# Patient Record
Sex: Female | Born: 1955 | Race: White | Hispanic: No | Smoking: Current every day smoker
Health system: Southern US, Community
[De-identification: ages and names within clinical notes are randomized; demographics above are authoritative.]

## PROBLEM LIST (undated history)

## (undated) DIAGNOSIS — F419 Anxiety disorder, unspecified: Secondary | ICD-10-CM

## (undated) HISTORY — PX: TONSILLECTOMY: SUR1361

---

## 1999-07-12 ENCOUNTER — Other Ambulatory Visit: Admission: RE | Admit: 1999-07-12 | Discharge: 1999-07-12 | Payer: Self-pay | Admitting: *Deleted

## 2001-10-27 ENCOUNTER — Other Ambulatory Visit: Admission: RE | Admit: 2001-10-27 | Discharge: 2001-10-27 | Payer: Self-pay | Admitting: Obstetrics & Gynecology

## 2002-11-09 ENCOUNTER — Ambulatory Visit (HOSPITAL_COMMUNITY): Admission: RE | Admit: 2002-11-09 | Discharge: 2002-11-09 | Payer: Self-pay | Admitting: Family Medicine

## 2002-11-09 ENCOUNTER — Encounter: Payer: Self-pay | Admitting: Family Medicine

## 2003-12-14 ENCOUNTER — Ambulatory Visit (HOSPITAL_COMMUNITY): Admission: RE | Admit: 2003-12-14 | Discharge: 2003-12-14 | Payer: Self-pay | Admitting: Family Medicine

## 2004-05-01 ENCOUNTER — Other Ambulatory Visit: Admission: RE | Admit: 2004-05-01 | Discharge: 2004-05-01 | Payer: Self-pay | Admitting: Obstetrics and Gynecology

## 2004-06-02 ENCOUNTER — Emergency Department (HOSPITAL_COMMUNITY): Admission: EM | Admit: 2004-06-02 | Discharge: 2004-06-02 | Payer: Self-pay | Admitting: Emergency Medicine

## 2007-06-26 ENCOUNTER — Observation Stay (HOSPITAL_COMMUNITY): Admission: AD | Admit: 2007-06-26 | Discharge: 2007-06-27 | Payer: Self-pay | Admitting: Cardiology

## 2007-06-26 ENCOUNTER — Encounter: Payer: Self-pay | Admitting: Emergency Medicine

## 2008-06-08 ENCOUNTER — Ambulatory Visit (HOSPITAL_COMMUNITY): Admission: RE | Admit: 2008-06-08 | Discharge: 2008-06-08 | Payer: Self-pay | Admitting: Family Medicine

## 2009-05-09 ENCOUNTER — Ambulatory Visit (HOSPITAL_COMMUNITY): Admission: RE | Admit: 2009-05-09 | Discharge: 2009-05-09 | Payer: Self-pay | Admitting: Family Medicine

## 2009-11-01 ENCOUNTER — Ambulatory Visit (HOSPITAL_COMMUNITY): Admission: RE | Admit: 2009-11-01 | Discharge: 2009-11-01 | Payer: Self-pay | Admitting: Family Medicine

## 2009-11-29 ENCOUNTER — Ambulatory Visit (HOSPITAL_COMMUNITY): Admission: RE | Admit: 2009-11-29 | Discharge: 2009-11-29 | Payer: Self-pay | Admitting: Internal Medicine

## 2010-09-25 ENCOUNTER — Ambulatory Visit (HOSPITAL_COMMUNITY)
Admission: RE | Admit: 2010-09-25 | Discharge: 2010-09-25 | Payer: Self-pay | Source: Home / Self Care | Attending: Internal Medicine | Admitting: Internal Medicine

## 2011-01-30 NOTE — Cardiovascular Report (Signed)
Crystal Douglas, Crystal Douglas                 ACCOUNT NO.:  [id number]   MEDICAL RECORD NO.:  [id number]          PATIENT TYPE:  INP   LOCATION:  4734                         FACILITY:  [facility]   PHYSICIAN:  Ritta Slot, MD     DATE OF BIRTH:  August 31, 1956   DATE OF PROCEDURE:  06/27/2007  DATE OF DISCHARGE:  06/27/2007                            CARDIAC CATHETERIZATION   CARDIAC CATHETERIZATION:   PROCEDURE PERFORMED:  1. Selective coronary angiography with native coronary circulation by      the Judkins technique.  2. Retrograde left heart catheterization.  3. Left ventricular angiography.   COMPLICATIONS:  None.   ENTRY SITE:  Right femoral artery.   DYE USED:  Omnipaque.   PATIENT PROFILE:  This is a 55 year old, heavy smoker with chest pain.  She has had negative troponins and negative ECGs overnight; however, she  is brought to the cath lab for further delineation of her coronary  arteries, as she has high risk factors for coronary artery disease.   PROCEDURE:  The patient was brought to the second floor of [facility] to the cardiac catheterization laboratory.  The right groin was  prepped and draped in the usual sterile fashion, 20 mL of 1% lidocaine  was infiltrated into the right groin around the right femoral artery.  She was given 3 mg of Versed and 50 mcg of fentanyl intravenous for  sedation.  The right groin was accessed using a regular needle, and the  J-wire was passed through that regular needle.  The needle was removed,  and a 6-French catheter was placed into the right femoral artery.  A JR4  catheter was inserted in the 6-French catheter over J-wire, and passed  up the descending aorta over the aortic artery and down the ascending  aorta.  It was engaged into the right coronary artery.  The right  coronary was injected with dye.  Views were obtained of the right  coronary artery and the cranial.  The right coronary catheter was then  exchanged for JL4  catheter.  It was exchanged over J-wire.  The JRL4  catheter was then inserted into the left main.  Images of the left main  were taken in AP cranial,  lateral cranial and AP caudal.  The JL4  catheter was then exchanged out for a pigtail catheter.  The pigtail was  inserted in the LV.  The LV gram was taken in RAO projection with 30 mL  of dye injected at a rate of 10 mL per second.  Complications were none.  Groin shots were taken to see if a closure device could be inserted.  Unfortunately, the sheath was just proximal to the bifurcation of the  SFA and the deep saphenous artery.  It was therefore decided not to  close as a risk for perforation.   FINDINGS:  Aortic pressure 89/52/69.  LV pressure is 91/2/10.  Pullback  pressures show 87/48/66.  There was no evidence of aortic stenosis by  pullback gradient.  1. Left main coronary artery appears normal.  2. Left anterior descending on coronary  artery appears normal.  3. The left circumflex artery appears normal.  4. The right coronary artery appears normal.  5. Ventriculogram showed normal ejection fraction with no evidence of      any wall motion abnormalities, and no evidence of mitral      regurgitation.   FINAL DIAGNOSES:  1. Normal coronary arteries.  2. Normal left ventriculography with a normal ejection fraction of      65%.   IMPRESSION:  Most likely noncardiac chest pain.   PLAN:  I have advised the patient to stop smoking, and I have advised  her to come back to see me in clinic in 10 days' time.      Ritta Slot, MD  Electronically Signed     HS/MEDQ  D:  06/27/2007  T:  06/28/2007  Job:  [id number]

## 2011-02-01 ENCOUNTER — Ambulatory Visit (INDEPENDENT_AMBULATORY_CARE_PROVIDER_SITE_OTHER): Payer: Self-pay | Admitting: Psychology

## 2011-02-01 DIAGNOSIS — F331 Major depressive disorder, recurrent, moderate: Secondary | ICD-10-CM

## 2011-02-01 DIAGNOSIS — F411 Generalized anxiety disorder: Secondary | ICD-10-CM

## 2011-02-02 NOTE — Discharge Summary (Signed)
**Note Crystal via Obfuscation** Douglas, [name]                 ACCOUNT NO.:  [id number]   MEDICAL RECORD NO.:  [id number]          PATIENT TYPE:  INP   LOCATION:  4734                         FACILITY:  [facility]   PHYSICIAN:  Madaline Savage, M.D.DATE OF BIRTH:  05/05/1956   DATE OF ADMISSION:  06/26/2007  DATE OF DISCHARGE:  06/27/2007                               DISCHARGE SUMMARY   DISCHARGE DIAGNOSES:  1. Chest pain; negative myocardial infarction.  Patent coronary      arteries with normal EF of 65%.  2. Small lung nodules 7 x 4 mm in the left lower lobe; follow with a      CT scan in 12 months.  3. Urinary tract infection.  Treated with Cipro.  With E. coli.  4. Tobacco abuse.  5. Hypotension secondary to medication.  6. Shellfish allergy.   DISCHARGE CONDITION:  Improved and stable.   PROCEDURES:  June 27, 2007 combined left heart catheterization by Dr.  Ritta Slot.  Negative coronary disease.   DISCHARGE MEDICATIONS:  1. Chantix 0.5 mg one daily for 3 days and 0.5 mg one twice a day for      3 days, then 1 mg twice a day.  Prescriptions given  2. Stop smoking.  3. Cipro 250 mg twice a day for 7 days for urinary infection.  4. Prilosec 20 mg daily at least for 2 weeks.  5. Xanax as before.   DISCHARGE INSTRUCTIONS:  1. He needs a CAT scan of the chest in 1 year; to see the primary      physician for arranging.  2. He may return to work July 01, 2007.  3. Increase activity slowly.  May shower or bathe.  No lifting for 2      days.  No driving for 2 days and no sexual activity for 2 days.  4. Wash right groin catheterization site with soap and water.  Call if      any bleeding, swelling or drainage.  5. See Dr. Lynnea Ferrier in approximately 10 days.  The office will call      with date and time.   HISTORY OF PRESENT ILLNESS:  A 55 year old white female patient of Dr.  Phillips Odor in [place], presented to [facility] ER with chest pain that  woke her from sleep at around 3:00 a.m. She  described midsternal with  some radiation to back and left shoulder, with associated symptoms --  described as a 5-6/10 pain.  She also complained of weakness; went to  the ER at [facility].  She was better after aspirin and sublingual  nitroglycerin with relief and no recurrence.  The week previous she had  chest heaviness at work with exertion; it would last 5-10 minutes and  resolve.  Prior to that she had no real history of health issues.   PAST MEDICAL HISTORY:  No coronary disease.  No hypertension.  Unknown  cholesterol.  She is menopausal.  She had also lost approximately 30  pounds with the job, where she is walking much more.   ALLERGIES:  PENICILLIN, TETRACYCLINE, CODEINE and SHELLFISH.  FAMILY HISTORY:  Positive for coronary disease in the female side of the  family; none in the female.   OUTPATIENT MEDICATIONS:  Xanax at bedtime.   SOCIAL HISTORY AND REVIEW OF SYSTEMS:  See H&P.   PHYSICAL EXAMINATION AT DISCHARGE:  Blood pressure 90s/50s; previously  that day she had been as low as 78/47.  She was given fluid, but this  was secondary to having been on Imdur and Lopressor.  Pulse 62, respiratory 20, temperature 97.8, oxygen saturation 96%.  HEART:  S1, S2 regular rate and rhythm.  LUNGS:  Clear.  ABDOMEN:  Positive bowel sounds.  EXTREMITIES: No edema and palpable pulses.   LABORATORY DATA:  Hemoglobin 12, hematocrit 35.4, WBC 7.3.  Prothrombin  time 14, INR 1.1 on heparin and she was therapeutic.  Sodium 140,  potassium 3.9, chloride 107, CO2 29, glucose 93, BUN 10,  creatinine  0.77, calcium 8.8.  Hemoglobin A1c 5.9.  CKs 48, 51, 51; MBs 1.9, 1.6;  troponin I  was 0.01 to less than 0.01.  Total cholesterol 139, triglycerides 51, HDL 46, LDL 83.  TSH 1.309.  Urinalysis:  Positive nitrites, small amount of leukocytes, wbc 3-6.  On  her culture she was positive for E. coli, which was sensitive to her  Cipro.   CHEST X-RAY:  (On admission)  Revealed no acute  cardiopulmonary disease;  but an 11 mm x 5 mm right upper lobe pulmonary nodule.  A chest CT was  recommended; not present on prior exam of 2005.   OTHER RADIOLOGY:  A CT of her chest revealed no pulmonary nodule as  identified in the region of the right lung that was found on chest x-  ray.  It was felt to be artifactual on the chest x-ray; but, there was a  7 x  4 mm nodule in the left lower lobe, which the radiologist notes  nodules of that side are statistically highly likely to be benign, but  current follow-up guidelines recommended a 12 month follow-up CT to  ensure lack of growth.   EKG:  Sinus rhythm without acute changes.   HOSPITAL COURSE:  Ms. [name] was admitted from [facility] to  [facility] by __________ , secondary to chest pain that seemed unstable  angina, with periods of what appeared to be stable angina prior to the  episode that woke her from sleep.  She was admitted to [facility]; was  started on Imdur and IV heparin.  She had no further episodes.  Her  pressure did drop due to her Lopressor and Imdur additions.  We gave her  fluid bolus the next morning and then she went to the catheterization  lab; was found to have normal course.   An incidental note was the lung nodule, which we discussed in detail and  that she would need follow-up.  Again, we recommend stopping tobacco  use.  We had tobacco consultation with her and she was given a  prescription for  Chantix.  She she does not truly want to stop smoking at this point, but  hopefully the fear of the nodule changing into something will motivate  her somewhat.   The patient was stable at discharge and would follow up with Dr.  Lynnea Ferrier.      Crystal Douglas. Crystal Douglas, N.P.    ______________________________  Madaline Savage, M.D.    LRI/MEDQ  D:  06/29/2007  T:  06/30/2007  Job:  [id number]   cc:   Sandria Senter  Lynnea Ferrier, MD  Edsel Petrin, D.O.

## 2011-02-05 ENCOUNTER — Ambulatory Visit (HOSPITAL_COMMUNITY): Payer: Self-pay | Admitting: Psychology

## 2011-02-15 ENCOUNTER — Encounter (INDEPENDENT_AMBULATORY_CARE_PROVIDER_SITE_OTHER): Payer: BC Managed Care – PPO | Admitting: Psychology

## 2011-02-15 DIAGNOSIS — F411 Generalized anxiety disorder: Secondary | ICD-10-CM

## 2011-02-15 DIAGNOSIS — F332 Major depressive disorder, recurrent severe without psychotic features: Secondary | ICD-10-CM

## 2011-03-01 ENCOUNTER — Encounter (INDEPENDENT_AMBULATORY_CARE_PROVIDER_SITE_OTHER): Payer: BC Managed Care – PPO | Admitting: Psychology

## 2011-03-01 DIAGNOSIS — F411 Generalized anxiety disorder: Secondary | ICD-10-CM

## 2011-03-01 DIAGNOSIS — F332 Major depressive disorder, recurrent severe without psychotic features: Secondary | ICD-10-CM

## 2011-03-20 ENCOUNTER — Encounter (INDEPENDENT_AMBULATORY_CARE_PROVIDER_SITE_OTHER): Payer: BC Managed Care – PPO | Admitting: Psychology

## 2011-03-20 DIAGNOSIS — F332 Major depressive disorder, recurrent severe without psychotic features: Secondary | ICD-10-CM

## 2011-03-20 DIAGNOSIS — F411 Generalized anxiety disorder: Secondary | ICD-10-CM

## 2011-04-10 ENCOUNTER — Encounter (HOSPITAL_COMMUNITY): Payer: BC Managed Care – PPO | Admitting: Psychology

## 2011-06-28 LAB — CBC
HCT: 35.4 — ABNORMAL LOW
HCT: 40.6
Hemoglobin: 12
Hemoglobin: 13.7
MCHC: 33.8
MCHC: 33.8
MCV: 90.7
MCV: 91.9
Platelets: 267
Platelets: 302
RBC: 3.85 — ABNORMAL LOW
RBC: 4.47
RDW: 13.1
RDW: 13.3
WBC: 7.3
WBC: 8.3

## 2011-06-28 LAB — BASIC METABOLIC PANEL
BUN: 10
BUN: 13
CO2: 28
CO2: 29
Calcium: 8.8
Calcium: 9
Chloride: 107
Chloride: 107
Creatinine, Ser: 0.76
Creatinine, Ser: 0.77
GFR calc Af Amer: >60
GFR calc Af Amer: >60
GFR calc non Af Amer: >60
GFR calc non Af Amer: >60
Glucose, Bld: 93
Glucose, Bld: 95
Potassium: 3.9
Potassium: 4.2
Sodium: 140
Sodium: 140

## 2011-06-28 LAB — URINE CULTURE
Colony Count: >=100000
Special Requests: NEGATIVE

## 2011-06-28 LAB — POCT CARDIAC MARKERS
CKMB, poc: 1.5
CKMB, poc: <1 — ABNORMAL LOW
CKMB, poc: <1 — ABNORMAL LOW
Myoglobin, poc: 55.9
Myoglobin, poc: 63.1
Myoglobin, poc: 71.1
Operator id: 106841
Operator id: 211291
Operator id: 211291
Troponin i, poc: <0.05
Troponin i, poc: <0.05
Troponin i, poc: <0.05

## 2011-06-28 LAB — CARDIAC PANEL(CRET KIN+CKTOT+MB+TROPI)
CK, MB: 1.6
CK, MB: 1.6
CK, MB: 1.9
Relative Index: INVALID
Relative Index: INVALID
Relative Index: INVALID
Total CK: 48
Total CK: 51
Total CK: 51
Troponin I: 0.01
Troponin I: 0.01
Troponin I: <0.01

## 2011-06-28 LAB — DIFFERENTIAL
Basophils Absolute: 0
Basophils Relative: 0
Eosinophils Absolute: 0.1
Eosinophils Relative: 1
Lymphocytes Relative: 25
Lymphs Abs: 2.1
Monocytes Absolute: 0.8 — ABNORMAL HIGH
Monocytes Relative: 10
Neutro Abs: 5.3
Neutrophils Relative %: 63

## 2011-06-28 LAB — LIPID PANEL
Cholesterol: 139
HDL: 46
LDL Cholesterol: 83
Total CHOL/HDL Ratio: 3
Triglycerides: 51
VLDL: 10

## 2011-06-28 LAB — URINE MICROSCOPIC-ADD ON

## 2011-06-28 LAB — PROTIME-INR
INR: 1.1
INR: 1.1
Prothrombin Time: 14
Prothrombin Time: 14.1

## 2011-06-28 LAB — URINALYSIS, ROUTINE W REFLEX MICROSCOPIC
Bilirubin Urine: NEGATIVE
Glucose, UA: NEGATIVE
Ketones, ur: NEGATIVE
Nitrite: POSITIVE — AB
Protein, ur: NEGATIVE
Specific Gravity, Urine: 1.012
Urobilinogen, UA: 0.2
pH: 6

## 2011-06-28 LAB — HEPARIN LEVEL (UNFRACTIONATED): Heparin Unfractionated: 0.89 — ABNORMAL HIGH

## 2011-06-28 LAB — APTT: aPTT: 36

## 2011-06-28 LAB — HEMOGLOBIN A1C
Hgb A1c MFr Bld: 5.9
Mean Plasma Glucose: 133

## 2011-06-28 LAB — TSH: TSH: 1.309

## 2011-06-28 LAB — D-DIMER, QUANTITATIVE: D-Dimer, Quant: 0.3

## 2011-06-28 LAB — MAGNESIUM: Magnesium: 2

## 2012-06-17 ENCOUNTER — Other Ambulatory Visit (HOSPITAL_COMMUNITY): Payer: Self-pay | Admitting: Internal Medicine

## 2012-06-17 DIAGNOSIS — Z139 Encounter for screening, unspecified: Secondary | ICD-10-CM

## 2012-06-19 ENCOUNTER — Ambulatory Visit (HOSPITAL_COMMUNITY)
Admission: RE | Admit: 2012-06-19 | Discharge: 2012-06-19 | Disposition: A | Discharge status: Home / Self Care | Payer: Managed Care, Other (non HMO) | Source: Ambulatory Visit | Attending: Internal Medicine | Admitting: Internal Medicine

## 2012-06-19 DIAGNOSIS — Z139 Encounter for screening, unspecified: Secondary | ICD-10-CM

## 2012-06-19 DIAGNOSIS — Z1231 Encounter for screening mammogram for malignant neoplasm of breast: Secondary | ICD-10-CM | POA: Insufficient documentation

## 2013-05-12 ENCOUNTER — Encounter (INDEPENDENT_AMBULATORY_CARE_PROVIDER_SITE_OTHER): Payer: Self-pay | Admitting: *Deleted

## 2013-05-13 ENCOUNTER — Encounter (INDEPENDENT_AMBULATORY_CARE_PROVIDER_SITE_OTHER): Payer: Self-pay

## 2013-12-22 ENCOUNTER — Telehealth (INDEPENDENT_AMBULATORY_CARE_PROVIDER_SITE_OTHER): Payer: Self-pay | Admitting: *Deleted

## 2013-12-22 ENCOUNTER — Other Ambulatory Visit (INDEPENDENT_AMBULATORY_CARE_PROVIDER_SITE_OTHER): Payer: Self-pay | Admitting: *Deleted

## 2013-12-22 DIAGNOSIS — Z86010 Personal history of colonic polyps: Secondary | ICD-10-CM

## 2013-12-22 DIAGNOSIS — Z1211 Encounter for screening for malignant neoplasm of colon: Secondary | ICD-10-CM

## 2013-12-22 DIAGNOSIS — Z800 Family history of malignant neoplasm of digestive organs: Secondary | ICD-10-CM

## 2013-12-22 NOTE — Telephone Encounter (Signed)
Patient needs movi prep 

## 2013-12-23 MED ORDER — PEG-KCL-NACL-NASULF-NA ASC-C 100 G PO SOLR: 1.0000 | Freq: Once | ORAL | Status: DC

## 2014-02-09 ENCOUNTER — Telehealth (INDEPENDENT_AMBULATORY_CARE_PROVIDER_SITE_OTHER): Payer: Self-pay | Admitting: *Deleted

## 2014-02-09 NOTE — Telephone Encounter (Signed)
  Procedure: tcs  Reason/Indication:  Hx polyps, fam hx colon ca  Has patient had this procedure before?  Yes, 2009 -- scanned  If so, when, by whom and where?    Is there a family history of colon cancer?  Yes, father  Who?  What age when diagnosed?    Is patient diabetic?   no      Does patient have prosthetic heart valve?  no  Do you have a pacemaker?  no  Has patient ever had endocarditis? no  Has patient had joint replacement within last 12 months?  no  Does patient tend to be constipated or take laxatives? no  Is patient on Coumadin, Plavix and/or Aspirin? no  Medications: alprazolam 1 mg bid nightly, lexapro 10 mg 1/2 tab daily  Allergies: pcn, tetracycline, codeina   Medication Adjustment:   Procedure date & time: 03/11/14 at 1030

## 2014-02-10 NOTE — Telephone Encounter (Signed)
agree

## 2014-02-12 ENCOUNTER — Other Ambulatory Visit (HOSPITAL_COMMUNITY): Payer: Self-pay | Admitting: Internal Medicine

## 2014-02-12 DIAGNOSIS — Z1231 Encounter for screening mammogram for malignant neoplasm of breast: Secondary | ICD-10-CM

## 2014-02-15 ENCOUNTER — Ambulatory Visit (HOSPITAL_COMMUNITY)
Admission: RE | Admit: 2014-02-15 | Discharge: 2014-02-15 | Disposition: A | Discharge status: Home / Self Care | Payer: Managed Care, Other (non HMO) | Source: Ambulatory Visit | Attending: Internal Medicine | Admitting: Internal Medicine

## 2014-02-15 DIAGNOSIS — Z1231 Encounter for screening mammogram for malignant neoplasm of breast: Secondary | ICD-10-CM | POA: Insufficient documentation

## 2014-03-11 ENCOUNTER — Encounter (HOSPITAL_COMMUNITY): Payer: Self-pay

## 2014-03-11 ENCOUNTER — Ambulatory Visit (HOSPITAL_COMMUNITY)
Admission: RE | Admit: 2014-03-11 | Discharge: 2014-03-11 | Disposition: A | Discharge status: Home / Self Care | Payer: Managed Care, Other (non HMO) | Source: Ambulatory Visit | Attending: Internal Medicine | Admitting: Internal Medicine

## 2014-03-11 ENCOUNTER — Encounter (HOSPITAL_COMMUNITY)
Admission: RE | Disposition: A | Discharge status: Home / Self Care | Payer: Self-pay | Source: Ambulatory Visit | Attending: Internal Medicine

## 2014-03-11 DIAGNOSIS — Z800 Family history of malignant neoplasm of digestive organs: Secondary | ICD-10-CM

## 2014-03-11 DIAGNOSIS — Z79899 Other long term (current) drug therapy: Secondary | ICD-10-CM | POA: Insufficient documentation

## 2014-03-11 DIAGNOSIS — D128 Benign neoplasm of rectum: Secondary | ICD-10-CM

## 2014-03-11 DIAGNOSIS — F411 Generalized anxiety disorder: Secondary | ICD-10-CM | POA: Insufficient documentation

## 2014-03-11 DIAGNOSIS — Z860100 Personal history of colon polyps, unspecified: Secondary | ICD-10-CM

## 2014-03-11 DIAGNOSIS — D129 Benign neoplasm of anus and anal canal: Secondary | ICD-10-CM

## 2014-03-11 DIAGNOSIS — F17200 Nicotine dependence, unspecified, uncomplicated: Secondary | ICD-10-CM | POA: Insufficient documentation

## 2014-03-11 DIAGNOSIS — D126 Benign neoplasm of colon, unspecified: Secondary | ICD-10-CM

## 2014-03-11 HISTORY — DX: Anxiety disorder, unspecified: F41.9

## 2014-03-11 HISTORY — PX: COLONOSCOPY: SHX5424

## 2014-03-11 SURGERY — COLONOSCOPY
Anesthesia: Moderate Sedation

## 2014-03-11 MED ORDER — MIDAZOLAM HCL 5 MG/5ML IJ SOLN
INTRAMUSCULAR | Status: DC | PRN
  Administered 2014-03-11 (×6): 2 mg via INTRAVENOUS

## 2014-03-11 MED ORDER — SODIUM CHLORIDE 0.9 % IV SOLN
INTRAVENOUS | Status: DC
  Administered 2014-03-11: 10:00:00 via INTRAVENOUS

## 2014-03-11 MED ORDER — ONDANSETRON HCL 4 MG/2ML IJ SOLN
INTRAMUSCULAR | Status: AC
  Filled 2014-03-11: qty 2

## 2014-03-11 MED ORDER — MEPERIDINE HCL 50 MG/ML IJ SOLN
INTRAMUSCULAR | Status: DC | PRN
  Administered 2014-03-11: 20 mg via INTRAVENOUS
  Administered 2014-03-11 (×2): 15 mg via INTRAVENOUS

## 2014-03-11 MED ORDER — ONDANSETRON HCL 4 MG/2ML IJ SOLN
4.0000 mg | Freq: Once | INTRAMUSCULAR | Status: AC
  Administered 2014-03-11: 4 mg via INTRAMUSCULAR

## 2014-03-11 MED ORDER — MEPERIDINE HCL 50 MG/ML IJ SOLN
INTRAMUSCULAR | Status: AC
  Filled 2014-03-11: qty 1

## 2014-03-11 MED ORDER — STERILE WATER FOR IRRIGATION IR SOLN
Status: DC | PRN
  Administered 2014-03-11: 10:00:00

## 2014-03-11 MED ORDER — MIDAZOLAM HCL 5 MG/5ML IJ SOLN
INTRAMUSCULAR | Status: AC
  Filled 2014-03-11: qty 5

## 2014-03-11 MED ORDER — LIDOCAINE HCL 2 % EX GEL
CUTANEOUS | Status: AC
  Filled 2014-03-11: qty 60

## 2014-03-11 MED ORDER — MIDAZOLAM HCL 5 MG/5ML IJ SOLN
INTRAMUSCULAR | Status: DC
Start: 2014-03-11 — End: 2014-03-11
  Filled 2014-03-11: qty 10

## 2014-03-11 NOTE — Discharge Instructions (Signed)
No aspirin or NSAIDs for one week Resume usual medications and diet. No driving for 24 hours. Physician will call with biopsy results.  Colon Polyps Polyps are lumps of extra tissue growing inside the body. Polyps can grow in the large intestine (colon). Most colon polyps are noncancerous (benign). However, some colon polyps can become cancerous over time. Polyps that are larger than a pea may be harmful. To be safe, caregivers remove and test all polyps. CAUSES  Polyps form when mutations in the genes cause your cells to grow and divide even though no more tissue is needed. RISK FACTORS There are a number of risk factors that can increase your chances of getting colon polyps. They include:  Being older than 50 years.  Family history of colon polyps or colon cancer.  Long-term colon diseases, such as colitis or Crohn disease.  Being overweight.  Smoking.  Being inactive.  Drinking too much alcohol. SYMPTOMS  Most small polyps do not cause symptoms. If symptoms are present, they may include:  Blood in the stool. The stool may look dark red or black.  Constipation or diarrhea that lasts longer than 1 week. DIAGNOSIS People often do not know they have polyps until their caregiver finds them during a regular checkup. Your caregiver can use 4 tests to check for polyps:  Digital rectal exam. The caregiver wears gloves and feels inside the rectum. This test would find polyps only in the rectum.  Barium enema. The caregiver puts a liquid called barium into your rectum before taking X-rays of your colon. Barium makes your colon look white. Polyps are dark, so they are easy to see in the X-ray pictures.  Sigmoidoscopy. A thin, flexible tube (sigmoidoscope) is placed into your rectum. The sigmoidoscope has a light and tiny camera in it. The caregiver uses the sigmoidoscope to look at the last third of your colon.  Colonoscopy. This test is like sigmoidoscopy, but the caregiver looks at  the entire colon. This is the most common method for finding and removing polyps. TREATMENT  Any polyps will be removed during a sigmoidoscopy or colonoscopy. The polyps are then tested for cancer. PREVENTION  To help lower your risk of getting more colon polyps:  Eat plenty of fruits and vegetables. Avoid eating fatty foods.  Do not smoke.  Avoid drinking alcohol.  Exercise every day.  Lose weight if recommended by your caregiver.  Eat plenty of calcium and folate. Foods that are rich in calcium include milk, cheese, and broccoli. Foods that are rich in folate include chickpeas, kidney beans, and spinach. HOME CARE INSTRUCTIONS Keep all follow-up appointments as directed by your caregiver. You may need periodic exams to check for polyps. SEEK MEDICAL CARE IF: You notice bleeding during a bowel movement.  Colonoscopy, Care After Refer to this sheet in the next few weeks. These instructions provide you with information on caring for yourself after your procedure. Your health care provider may also give you more specific instructions. Your treatment has been planned according to current medical practices, but problems sometimes occur. Call your health care provider if you have any problems or questions after your procedure. WHAT TO EXPECT AFTER THE PROCEDURE  After your procedure, it is typical to have the following:  A small amount of blood in your stool.  Moderate amounts of gas and mild abdominal cramping or bloating. HOME CARE INSTRUCTIONS  Do not drive, operate machinery, or sign important documents for 24 hours.  You may shower and resume your regular  physical activities, but move at a slower pace for the first 24 hours.  Take frequent rest periods for the first 24 hours.  Walk around or put a warm pack on your abdomen to help reduce abdominal cramping and bloating.  Drink enough fluids to keep your urine clear or pale yellow.  You may resume your normal diet as  instructed by your health care provider. Avoid heavy or fried foods that are hard to digest.  Avoid drinking alcohol for 24 hours or as instructed by your health care provider.  Only take over-the-counter or prescription medicines as directed by your health care provider.  If a tissue sample (biopsy) was taken during your procedure:  Do not take aspirin or blood thinners for 7 days, or as instructed by your health care provider.  Do not drink alcohol for 7 days, or as instructed by your health care provider.  Eat soft foods for the first 24 hours. SEEK MEDICAL CARE IF: You have persistent spotting of blood in your stool 2-3 days after the procedure. SEEK IMMEDIATE MEDICAL CARE IF:  You have more than a small spotting of blood in your stool.  You pass large blood clots in your stool.  Your abdomen is swollen (distended).  You have nausea or vomiting.  You have a fever.  You have increasing abdominal pain that is not relieved with medicine.

## 2014-03-11 NOTE — Op Note (Signed)
COLONOSCOPY PROCEDURE REPORT  PATIENT:  Crystal Douglas  MR#:  [id number] Birthdate:  February 25, 1956, 58 y.o., female Endoscopist:  Dr. Rogene Houston, MD Referred By:  Dr. Asencion Noble, MD  Procedure Date: 03/11/2014  Procedure:   Colonoscopy  Indications: Patient is 58 year old Caucasian female with history of colonic adenomas and family history of colon carcinoma. Patient's last colonoscopy was in August 2009 with removal of tubular adenoma and tubulovillous adenoma but examination was not complete and was followed by barium enema.  Informed Consent:  The procedure and risks were reviewed with the patient and informed consent was obtained.  Medications:  Demerol 50 mg IV Versed 12 mg IV  Description of procedure:  After a digital rectal exam was performed, that colonoscope was advanced from the anus through the rectum and colon to the area of the cecum, ileocecal valve and appendiceal orifice. The cecum was deeply intubated. These structures were well-seen and photographed for the record. From the level of the cecum and ileocecal valve, the scope was slowly and cautiously withdrawn. The mucosal surfaces were carefully surveyed utilizing scope tip to flexion to facilitate fold flattening as needed. The scope was pulled down into the rectum where a thorough exam including retroflexion was performed. Please note procedure performed with Slim colonoscope.  Findings:   Prep satisfactory. She had stool according to mucosa of cecum landmarks were well-seen after washing. 4 mm cecal polyp ablated via cold biopsy. Four small polyps were ablated via cold biopsy from splenic flexure and submitted together. Four small polyps were ablated via cold biopsy from sigmoid colon and submitted together.  There were 11 more small polyps in sigmoid colon and these were ablated with APC. 10 mm polyp on a short stalk snared from the rectosigmoid junction. Three small rectal polyps were coagulated using snare  tip. Small hemorrhoids below the dentate line.   Therapeutic/Diagnostic Maneuvers Performed:  See above  Complications:  None  Cecal Withdrawal Time:  38 minutes  Impression:  Examination performed to cecum. Patient had 24 polyps all together all of which were small with exception of polyp at rectosigmoid junction which was 10 mm. This polyp was snared. Nine small polyps were ablated via cold biopsy as above. Fourteen small polyps were coagulated using APC and or snare tip. Of these 11 were located at sigmoid colon and 3 at rectum. External hemorrhoids.  Recommendations:  Standard instructions given. I will contact patient with biopsy results and further recommendations.  REHMAN,NAJEEB U  03/11/2014 11:55 AM  CC: Dr. Asencion Noble, MD & Dr. Rayne Du ref. provider found

## 2014-03-11 NOTE — H&P (Signed)
Crystal Douglas is an 58 y.o. female.   Chief Complaint: Patient is here for colonoscopy. HPI: Patient is 58 year old Caucasian female with history of colonic adenomas and is here for surveillance colonoscopy. Her Lasix at was at August 2009 at [facility] in [place], [place] and was incomplete ascending colon and was followed by barium enema. On that exam she had a tubular adenoma and tubulovillous adenoma removed. 3 small polyps were coagulated. She denies rectal bleeding diarrhea or constipation. She has postprandial bloating which is related to that she likes to drink milk. Family history significant for colon carcinoma in her father who is 32 at the time of diagnosis and now is 59 years old.  Past Medical History  Diagnosis Date  . Anxiety     Past Surgical History  Procedure Laterality Date  . Tonsillectomy      '@14'  years of age    Family History  Problem Relation Age of Onset  . Colon cancer Father   . Colon cancer Other    Social History:  reports that she has been smoking.  She does not have any smokeless tobacco history on file. She reports that she does not drink alcohol or use illicit drugs.  Allergies:  Allergies  Allergen Reactions  . Codeine Other (See Comments)    Patient states "things were crawling on me".  . Lactose Intolerance (Gi) Hives  . Penicillins Hives  . Shellfish Allergy Nausea And Vomiting  . Tetracyclines & Related Hives    Medications Prior to Admission  Medication Sig Dispense Refill  . ALPRAZolam (XANAX) 1 MG tablet Take 2 mg by mouth at bedtime.      Marland Kitchen escitalopram (LEXAPRO) 5 MG tablet Take 2.5 mg by mouth daily.      . peg 3350 powder (MOVIPREP) 100 G SOLR Take 1 kit (200 g total) by mouth once.  1 kit  0    No results found for this or any previous visit (from the past 48 hour(s)). No results found.  ROS  Blood pressure 101/61, pulse 60, temperature 97.9 F (36.6 C), temperature source Oral, resp. rate 18, height '5\' 5"'  (1.651 m), weight  160 lb (72.576 kg), SpO2 96.00%. Physical Exam  Constitutional: She appears well-developed and well-nourished.  HENT:  Mouth/Throat: Oropharynx is clear and moist.  Eyes: Conjunctivae are normal. No scleral icterus.  Neck: No thyromegaly present.  Cardiovascular: Normal rate, regular rhythm and normal heart sounds.   No murmur heard. Respiratory: Effort normal and breath sounds normal.  GI: Soft. She exhibits no distension and no mass. There is no tenderness.  Musculoskeletal: She exhibits no edema.  Lymphadenopathy:    She has no cervical adenopathy.  Neurological: She is alert.  Skin: Skin is warm and dry.     Assessment/Plan History of colonic adenomas. Family history of colon carcinoma father(late onset). Surveillance colonoscopy.  REHMAN,NAJEEB U 03/11/2014, 10:33 AM

## 2014-03-12 ENCOUNTER — Encounter (HOSPITAL_COMMUNITY): Payer: Self-pay | Admitting: Internal Medicine

## 2014-03-29 ENCOUNTER — Encounter (INDEPENDENT_AMBULATORY_CARE_PROVIDER_SITE_OTHER): Payer: Self-pay | Admitting: *Deleted

## 2016-02-15 NOTE — Patient Instructions (Signed)
Crystal Douglas  02/15/2016     @PREFPERIOPPHARMACY @   Your procedure is scheduled on 02/21/2016.  Report to [facility] at 7:00 A.M.  Call this number if you have problems the morning of surgery:  [phone]   Remember:  Do not eat food or drink liquids after midnight.  Take these medicines the morning of surgery with A SIP OF WATER Xanax, Lexapro   Do not wear jewelry, make-up or nail polish.  Do not wear lotions, powders, or perfumes.  You may wear deodorant.  Do not shave 48 hours prior to surgery.  Men may shave face and neck.  Do not bring valuables to the hospital.  [facility] is not responsible for any belongings or valuables.  Contacts, dentures or bridgework may not be worn into surgery.  Leave your suitcase in the car.  After surgery it may be brought to your room.  For patients admitted to the hospital, discharge time will be determined by your treatment team.  Patients discharged the day of surgery will not be allowed to drive home.    Please read over the following fact sheets that you were given. Anesthesia Post-op Instructions     PATIENT INSTRUCTIONS POST-ANESTHESIA  IMMEDIATELY FOLLOWING SURGERY:  Do not drive or operate machinery for the first twenty four hours after surgery.  Do not make any important decisions for twenty four hours after surgery or while taking narcotic pain medications or sedatives.  If you develop intractable nausea and vomiting or a severe headache please notify your doctor immediately.  FOLLOW-UP:  Please make an appointment with your surgeon as instructed. You do not need to follow up with anesthesia unless specifically instructed to do so.  WOUND CARE INSTRUCTIONS (if applicable):  Keep a dry clean dressing on the anesthesia/puncture wound site if there is drainage.  Once the wound has quit draining you may leave it open to air.  Generally you should leave the bandage intact for twenty four hours unless there is drainage.  If the  epidural site drains for more than 36-48 hours please call the anesthesia department.  QUESTIONS?:  Please feel free to call your physician or the hospital operator if you have any questions, and they will be happy to assist you.       A cataract is a clouding of the lens of the eye. When a lens becomes cloudy, vision is reduced based on the degree and nature of the clouding. Surgery may be needed to improve vision. Surgery removes the cloudy lens and usually replaces it with a substitute lens (intraocular lens, IOL). LET YOUR EYE DOCTOR KNOW ABOUT:  Allergies to food or medicine.  Medicines taken including herbs, eye drops, over-the-counter medicines, and creams.  Use of steroids (by mouth or creams).  Previous problems with anesthetics or numbing medicine.  History of bleeding problems or blood clots.  Previous surgery.  Other health problems, including diabetes and kidney problems.  Possibility of pregnancy, if this applies. RISKS AND COMPLICATIONS  Infection.  Inflammation of the eyeball (endophthalmitis) that can spread to both eyes (sympathetic ophthalmia).  Poor wound healing.  If an IOL is inserted, it can later fall out of proper position. This is very uncommon.  Clouding of the part of your eye that holds an IOL in place. This is called an "after-cataract." These are uncommon but easily treated. BEFORE THE PROCEDURE  Do not eat or drink anything except small amounts of water for 8 to 12 before your surgery,  or as directed by your caregiver.  Unless you are told otherwise, continue any eye drops you have been prescribed.  Talk to your primary caregiver about all other medicines that you take (both prescription and nonprescription). In some cases, you may need to stop or change medicines near the time of your surgery. This is most important if you are taking blood-thinning medicine.Do not stop medicines unless you are told to do so.  Arrange for someone to drive  you to and from the procedure.  Do not put contact lenses in either eye on the day of your surgery. PROCEDURE There is more than one method for safely removing a cataract. Your doctor can explain the differences and help determine which is best for you. Phacoemulsification surgery is the most common form of cataract surgery.  An injection is given behind the eye or eye drops are given to make this a painless procedure.  A small cut (incision) is made on the edge of the clear, dome-shaped surface that covers the front of the eye (cornea).  A tiny probe is painlessly inserted into the eye. This device gives off ultrasound waves that soften and break up the cloudy center of the lens. This makes it easier for the cloudy lens to be removed by suction.  An IOL may be implanted.  The normal lens of the eye is covered by a clear capsule. Part of that capsule is intentionally left in the eye to support the IOL.  Your surgeon may or may not use stitches to close the incision. There are other forms of cataract surgery that require a larger incision and stitches to close the eye. This approach is taken in cases where the doctor feels that the cataract cannot be easily removed using phacoemulsification. AFTER THE PROCEDURE  When an IOL is implanted, it does not need care. It becomes a permanent part of your eye and cannot be seen or felt.  Your doctor will schedule follow-up exams to check on your progress.  Review your other medicines with your doctor to see which can be resumed after surgery.  Use eye drops or take medicine as prescribed by your doctor.   This information is not intended to replace advice given to you by your health care provider. Make sure you discuss any questions you have with your health care provider.   Document Released: 08/23/2011 Document Revised: 09/24/2014 Document Reviewed: 08/23/2011 Elsevier Interactive Patient Education [employer].

## 2016-02-16 ENCOUNTER — Encounter (HOSPITAL_COMMUNITY): Payer: Self-pay

## 2016-02-16 ENCOUNTER — Encounter (HOSPITAL_COMMUNITY)
Admission: RE | Admit: 2016-02-16 | Discharge: 2016-02-16 | Disposition: A | Discharge status: Home / Self Care | Payer: 59 | Source: Ambulatory Visit | Attending: Ophthalmology | Admitting: Ophthalmology

## 2016-02-16 ENCOUNTER — Other Ambulatory Visit: Payer: Self-pay

## 2016-02-16 DIAGNOSIS — Z01812 Encounter for preprocedural laboratory examination: Secondary | ICD-10-CM | POA: Diagnosis present

## 2016-02-16 DIAGNOSIS — Z01810 Encounter for preprocedural cardiovascular examination: Secondary | ICD-10-CM | POA: Insufficient documentation

## 2016-02-16 LAB — BASIC METABOLIC PANEL WITH GFR
Anion gap: 6 (ref 5–15)
BUN: 9 mg/dL (ref 6–20)
CO2: 23 mmol/L (ref 22–32)
Calcium: 9.2 mg/dL (ref 8.9–10.3)
Chloride: 109 mmol/L (ref 101–111)
Creatinine, Ser: 0.62 mg/dL (ref 0.44–1.00)
GFR calc Af Amer: >60 mL/min
GFR calc non Af Amer: >60 mL/min
Glucose, Bld: 92 mg/dL (ref 65–99)
Potassium: 4.1 mmol/L (ref 3.5–5.1)
Sodium: 138 mmol/L (ref 135–145)

## 2016-02-16 LAB — CBC
HCT: 42.9 % (ref 36.0–46.0)
Hemoglobin: 14.7 g/dL (ref 12.0–15.0)
MCH: 30.3 pg (ref 26.0–34.0)
MCHC: 34.3 g/dL (ref 30.0–36.0)
MCV: 88.5 fL (ref 78.0–100.0)
Platelets: 255 K/uL (ref 150–400)
RBC: 4.85 MIL/uL (ref 3.87–5.11)
RDW: 13 % (ref 11.5–15.5)
WBC: 6.4 K/uL (ref 4.0–10.5)

## 2016-02-21 ENCOUNTER — Ambulatory Visit (HOSPITAL_COMMUNITY): Payer: 59 | Admitting: Anesthesiology

## 2016-02-21 ENCOUNTER — Ambulatory Visit (HOSPITAL_COMMUNITY)
Admission: RE | Admit: 2016-02-21 | Discharge: 2016-02-21 | Disposition: A | Discharge status: Home / Self Care | Payer: 59 | Source: Ambulatory Visit | Attending: Ophthalmology | Admitting: Ophthalmology

## 2016-02-21 ENCOUNTER — Encounter (HOSPITAL_COMMUNITY)
Admission: RE | Disposition: A | Discharge status: Home / Self Care | Payer: Self-pay | Source: Ambulatory Visit | Attending: Ophthalmology

## 2016-02-21 ENCOUNTER — Encounter (HOSPITAL_COMMUNITY): Payer: Self-pay | Admitting: *Deleted

## 2016-02-21 DIAGNOSIS — F329 Major depressive disorder, single episode, unspecified: Secondary | ICD-10-CM | POA: Insufficient documentation

## 2016-02-21 DIAGNOSIS — Z79899 Other long term (current) drug therapy: Secondary | ICD-10-CM | POA: Insufficient documentation

## 2016-02-21 DIAGNOSIS — F419 Anxiety disorder, unspecified: Secondary | ICD-10-CM | POA: Insufficient documentation

## 2016-02-21 DIAGNOSIS — H268 Other specified cataract: Secondary | ICD-10-CM | POA: Diagnosis not present

## 2016-02-21 DIAGNOSIS — F17200 Nicotine dependence, unspecified, uncomplicated: Secondary | ICD-10-CM | POA: Insufficient documentation

## 2016-02-21 HISTORY — PX: CATARACT EXTRACTION W/PHACO: SHX586

## 2016-02-21 SURGERY — PHACOEMULSIFICATION, CATARACT, WITH IOL INSERTION
Anesthesia: Monitor Anesthesia Care | Site: Eye | Laterality: Right

## 2016-02-21 MED ORDER — ONDANSETRON HCL 4 MG/2ML IJ SOLN: 4.0000 mg | Freq: Once | INTRAMUSCULAR | Status: DC | PRN

## 2016-02-21 MED ORDER — LIDOCAINE HCL (PF) 1 % IJ SOLN
INTRAMUSCULAR | Status: DC | PRN
  Administered 2016-02-21: 1 mL

## 2016-02-21 MED ORDER — PROVISC 10 MG/ML IO SOLN
INTRAOCULAR | Status: DC | PRN
  Administered 2016-02-21: 0.85 mL via INTRAOCULAR

## 2016-02-21 MED ORDER — PHENYLEPHRINE HCL 2.5 % OP SOLN
1.0000 [drp] | OPHTHALMIC | Status: AC
Start: 2016-02-21 — End: 2016-02-21
  Administered 2016-02-21 (×3): 1 [drp] via OPHTHALMIC

## 2016-02-21 MED ORDER — FENTANYL CITRATE (PF) 100 MCG/2ML IJ SOLN
INTRAMUSCULAR | Status: AC
  Filled 2016-02-21: qty 2

## 2016-02-21 MED ORDER — MIDAZOLAM HCL 2 MG/2ML IJ SOLN
INTRAMUSCULAR | Status: AC
  Filled 2016-02-21: qty 2

## 2016-02-21 MED ORDER — BSS IO SOLN
INTRAOCULAR | Status: DC | PRN
  Administered 2016-02-21: 15 mL

## 2016-02-21 MED ORDER — MIDAZOLAM HCL 2 MG/2ML IJ SOLN
1.0000 mg | INTRAMUSCULAR | Status: DC | PRN
  Administered 2016-02-21: 2 mg via INTRAVENOUS

## 2016-02-21 MED ORDER — FENTANYL CITRATE (PF) 100 MCG/2ML IJ SOLN: 25.0000 ug | INTRAMUSCULAR | Status: DC | PRN

## 2016-02-21 MED ORDER — EPINEPHRINE HCL 1 MG/ML IJ SOLN
INTRAOCULAR | Status: DC | PRN
  Administered 2016-02-21: 500 mL

## 2016-02-21 MED ORDER — KETOROLAC TROMETHAMINE 0.5 % OP SOLN
1.0000 [drp] | OPHTHALMIC | Status: AC
Start: 2016-02-21 — End: 2016-02-21
  Administered 2016-02-21 (×3): 1 [drp] via OPHTHALMIC

## 2016-02-21 MED ORDER — TETRACAINE 0.5 % OP SOLN OPTIME - NO CHARGE
OPHTHALMIC | Status: DC | PRN
  Administered 2016-02-21: 2 [drp] via OPHTHALMIC

## 2016-02-21 MED ORDER — FENTANYL CITRATE (PF) 100 MCG/2ML IJ SOLN
25.0000 ug | INTRAMUSCULAR | Status: AC
  Administered 2016-02-21 (×2): 25 ug via INTRAVENOUS

## 2016-02-21 MED ORDER — LIDOCAINE HCL (PF) 1 % IJ SOLN
INTRAMUSCULAR | Status: AC
  Filled 2016-02-21: qty 2

## 2016-02-21 MED ORDER — EPINEPHRINE HCL 1 MG/ML IJ SOLN
INTRAMUSCULAR | Status: AC
  Filled 2016-02-21: qty 1

## 2016-02-21 MED ORDER — TETRACAINE HCL 0.5 % OP SOLN
1.0000 [drp] | OPHTHALMIC | Status: AC
  Administered 2016-02-21 (×3): 1 [drp] via OPHTHALMIC

## 2016-02-21 MED ORDER — LACTATED RINGERS IV SOLN
INTRAVENOUS | Status: DC
  Administered 2016-02-21: 1000 mL via INTRAVENOUS

## 2016-02-21 MED ORDER — CYCLOPENTOLATE-PHENYLEPHRINE 0.2-1 % OP SOLN
1.0000 [drp] | OPHTHALMIC | Status: AC
  Administered 2016-02-21 (×3): 1 [drp] via OPHTHALMIC

## 2016-02-21 SURGICAL SUPPLY — 9 items
CLOTH BEACON ORANGE TIMEOUT ST (SAFETY) ×2 IMPLANT
EYE SHIELD UNIVERSAL CLEAR (GAUZE/BANDAGES/DRESSINGS) ×2 IMPLANT
GLOVE BIO SURGEON STRL SZ 6.5 (GLOVE) ×2 IMPLANT
GLOVE EXAM NITRILE MD LF STRL (GLOVE) ×2 IMPLANT
LENS ALC ACRYL/TECN (Ophthalmic Related) ×2 IMPLANT
PAD ARMBOARD 7.5X6 YLW CONV (MISCELLANEOUS) ×2 IMPLANT
TAPE SURG TRANSPORE 1 IN (GAUZE/BANDAGES/DRESSINGS) ×1 IMPLANT
TAPE SURGICAL TRANSPORE 1 IN (GAUZE/BANDAGES/DRESSINGS) ×1
WATER STERILE IRR 250ML POUR (IV SOLUTION) ×2 IMPLANT

## 2016-02-21 NOTE — Anesthesia Procedure Notes (Signed)
Procedure Name: MAC Date/Time: 02/21/2016 8:35 AM Performed by: Andree Elk, Thaddus Mcdowell A Pre-anesthesia Checklist: Timeout performed, Patient identified, Emergency Drugs available, Suction available and Patient being monitored Oxygen Delivery Method: Nasal cannula

## 2016-02-21 NOTE — Anesthesia Postprocedure Evaluation (Signed)
Anesthesia Post Note  Patient: Crystal Douglas  Procedure(s) Performed: Procedure(s) (LRB): CATARACT EXTRACTION PHACO AND INTRAOCULAR LENS PLACEMENT (IOC) (Right)  Patient location during evaluation: NICU Anesthesia Type: MAC Level of consciousness: awake and alert and oriented Pain management: pain level controlled Vital Signs Assessment: post-procedure vital signs reviewed and stable Respiratory status: spontaneous breathing Cardiovascular status: stable Postop Assessment: no signs of nausea or vomiting Anesthetic complications: no    Last Vitals:  Filed Vitals:   02/21/16 0825 02/21/16 0830  BP: 95/56 90/59  Pulse:    Temp:    Resp: 20 19    Last Pain: There were no vitals filed for this visit.               Amyiah Gaba A

## 2016-02-21 NOTE — Discharge Instructions (Signed)
[facility] Instructions [place] [id number]      1. Avoid closing eyes tightly. One often closes the eye tightly when laughing, talking, sneezing, coughing or if they feel irritated. At these times, you should be careful not to close your eyes tightly.  2. Instill eye drops as instructed. To instill drops in your eye, open it, look up and have someone gently pull the lower lid down and instill a couple of drops inside the lower lid.  3. Do not touch upper lid.  4. Take Advil or Tylenol for pain.  5. You may use either eye for near work, such as reading or sewing and you may watch television.  6. You may have your hair done at the beauty parlor at any time.  7. Wear dark glasses with or without your own glasses if you are in bright light.  8. Call our office at [phone] or [phone] if you have sharp pain in your eye or unusual symptoms.  9.  FOLLOW UP WITH DR. SHAPIRO TODAY IN HIS [place] OFFICE AT 2:45pm.    I have received a copy of the above instructions and will follow them.     IF YOU ARE IN IMMEDIATE DANGER CALL 911!  It is important for you to keep your follow-up appointment with your physician after discharge, OR, for you /your caregiver to make a follow-up appointment with your physician / medical provider after discharge.  Show these instructions to the next healthcare provider you see. Monitored Anesthesia Care Monitored anesthesia care is an anesthesia service for a medical procedure. Anesthesia is the loss of the ability to feel pain. It is produced by medicines called anesthetics. It may affect a small area of your body (local anesthesia), a large area of your body (regional anesthesia), or your entire body (general anesthesia). The need for monitored anesthesia care depends your procedure, your condition, and the potential need for regional or general anesthesia. It is often provided  during procedures where:   General anesthesia may be needed if there are complications. This is because you need special care when you are under general anesthesia.   You will be under local or regional anesthesia. This is so that you are able to have higher levels of anesthesia if needed.   You will receive calming medicines (sedatives). This is especially the case if sedatives are given to put you in a semi-conscious state of relaxation (deep sedation). This is because the amount of sedative needed to produce this state can be hard to predict. Too much of a sedative can produce general anesthesia. Monitored anesthesia care is performed by one or more health care providers who have special training in all types of anesthesia. You will need to meet with these health care providers before your procedure. During this meeting, they will ask you about your medical history. They will also give you instructions to follow. (For example, you will need to stop eating and drinking before your procedure. You may also need to stop or change medicines you are taking.) During your procedure, your health care providers will stay with you. They will:   Watch your condition. This includes watching your blood pressure, breathing, and level of pain.   Diagnose and treat problems that occur.   Give medicines if they are needed. These may include calming medicines (sedatives) and anesthetics.  Make sure you are comfortable.  Having monitored anesthesia care does not necessarily mean that you will be under anesthesia. It does mean that your health care providers will be able to manage anesthesia if you need it or if it occurs. It also means that you will be able to have a different type of anesthesia than you are having if you need it. When your procedure is complete, your health care providers will continue to watch your condition. They will make sure any medicines wear off before you are allowed to go home.      This information is not intended to replace advice given to you by your health care provider. Make sure you discuss any questions you have with your health care provider.   Document Released: 05/30/2005 Document Revised: 09/24/2014 Document Reviewed: 10/15/2012 Elsevier Interactive Patient Education [employer].

## 2016-02-21 NOTE — Transfer of Care (Signed)
Immediate Anesthesia Transfer of Care Note  Patient: Crystal Douglas  Procedure(s) Performed: Procedure(s) with comments: CATARACT EXTRACTION PHACO AND INTRAOCULAR LENS PLACEMENT (IOC) (Right) - CDE:3.66  Patient Location: Short Stay  Anesthesia Type:MAC  Level of Consciousness: awake, alert , oriented and patient cooperative  Airway & Oxygen Therapy: Patient Spontanous Breathing  Post-op Assessment: Report given to RN and Post -op Vital signs reviewed and stable  Post vital signs: Reviewed and stable  Last Vitals:  Filed Vitals:   02/21/16 0825 02/21/16 0830  BP: 95/56 90/59  Pulse:    Temp:    Resp: 20 19    Last Pain: There were no vitals filed for this visit.    Patients Stated Pain Goal: 8 ([id number])  Complications: No apparent anesthesia complications

## 2016-02-21 NOTE — Op Note (Signed)
Patient brought to the operating room and prepped and draped in the usual manner.  Lid speculum inserted in right eye.  Stab incision made at the twelve o'clock position.  Intraocular Xylocaine instilled.  Provisc instilled in the anterior chamber.   A 2.4 mm. Stab incision was made temporally.  An anterior capsulotomy was done with a bent 25 gauge needle.  The nucleus was hydrodissected.  The Phaco tip was inserted in the anterior chamber and the nucleus was emulsified.  CDE was 3.66.  The cortical material was then removed with the I and A tip.  Posterior capsule was the polished.  The anterior chamber was deepened with Provisc.  A 19.5 Diopter Hoya Model 250C IOL was then inserted in the capsular bag.  Provisc was then removed with the I and A tip.  The wound was then hydrated.  Patient sent to the Recovery Room in good condition with follow up in my office.  Preoperative Diagnosis:  Nuclear Cataract OD Postoperative Diagnosis:  Same Procedure name: Kelman Phacoemulsification OD with IOL

## 2016-02-21 NOTE — Anesthesia Preprocedure Evaluation (Signed)
Anesthesia Evaluation  Patient identified by MRN, date of birth, ID band Patient awake    Reviewed: Allergy & Precautions, NPO status , Patient's Chart, lab work & pertinent test results  Airway Mallampati: II  TM Distance: >3 FB     Dental  (+) Teeth Intact   Pulmonary Current Smoker,    breath sounds clear to auscultation       Cardiovascular negative cardio ROS   Rhythm:Regular Rate:Normal     Neuro/Psych PSYCHIATRIC DISORDERS Anxiety    GI/Hepatic negative GI ROS,   Endo/Other    Renal/GU      Musculoskeletal   Abdominal   Peds  Hematology   Anesthesia Other Findings   Reproductive/Obstetrics                             Anesthesia Physical Anesthesia Plan  ASA: II  Anesthesia Plan: MAC   Post-op Pain Management:    Induction: Intravenous  Airway Management Planned: Nasal Cannula  Additional Equipment:   Intra-op Plan:   Post-operative Plan:   Informed Consent: I have reviewed the patients History and Physical, chart, labs and discussed the procedure including the risks, benefits and alternatives for the proposed anesthesia with the patient or authorized representative who has indicated his/her understanding and acceptance.     Plan Discussed with:   Anesthesia Plan Comments:         Anesthesia Quick Evaluation

## 2016-02-21 NOTE — H&P (Signed)
The patient was re examined and there is no change in the patients condition since the original H and P. 

## 2016-02-21 NOTE — Addendum Note (Signed)
Addendum  created 02/21/16 [id number] by Mickel Baas, CRNA   Modules edited: Anesthesia Responsible Staff

## 2016-02-22 ENCOUNTER — Encounter (HOSPITAL_COMMUNITY): Payer: Self-pay | Admitting: Ophthalmology

## 2016-03-08 ENCOUNTER — Encounter (HOSPITAL_COMMUNITY): Payer: Self-pay

## 2016-03-08 ENCOUNTER — Encounter (HOSPITAL_COMMUNITY)
Admission: RE | Admit: 2016-03-08 | Discharge: 2016-03-08 | Disposition: A | Discharge status: Home / Self Care | Payer: Managed Care, Other (non HMO) | Source: Ambulatory Visit | Attending: Ophthalmology | Admitting: Ophthalmology

## 2016-03-12 NOTE — Anesthesia Preprocedure Evaluation (Addendum)
Anesthesia Evaluation  Patient identified by MRN, date of birth, ID band Patient awake    Reviewed: Allergy & Precautions, NPO status , Patient's Chart, lab work & pertinent test results  Airway Mallampati: II  TM Distance: >3 FB Neck ROM: Full    Dental  (+) Teeth Intact, Dental Advisory Given   Pulmonary Current Smoker,    breath sounds clear to auscultation       Cardiovascular negative cardio ROS   Rhythm:Regular Rate:Bradycardia     Neuro/Psych Anxiety negative neurological ROS     GI/Hepatic negative GI ROS, Neg liver ROS,   Endo/Other  negative endocrine ROS  Renal/GU negative Renal ROS  negative genitourinary   Musculoskeletal negative musculoskeletal ROS (+)   Abdominal   Peds negative pediatric ROS (+)  Hematology negative hematology ROS (+)   Anesthesia Other Findings   Reproductive/Obstetrics negative OB ROS                           Lab Results  Component Value Date   WBC 6.4 02/16/2016   HGB 14.7 02/16/2016   HCT 42.9 02/16/2016   MCV 88.5 02/16/2016   PLT 255 02/16/2016   Lab Results  Component Value Date   CREATININE 0.62 02/16/2016   BUN 9 02/16/2016   NA 138 02/16/2016   K 4.1 02/16/2016   CL 109 02/16/2016   CO2 23 02/16/2016   Lab Results  Component Value Date   INR 1.1 06/27/2007   INR 1.1 06/26/2007   02/2016 EKG: normal sinus rhythm.    Anesthesia Physical Anesthesia Plan  ASA: II  Anesthesia Plan: MAC   Post-op Pain Management:    Induction: Intravenous  Airway Management Planned: Nasal Cannula  Additional Equipment:   Intra-op Plan:   Post-operative Plan:   Informed Consent: I have reviewed the patients History and Physical, chart, labs and discussed the procedure including the risks, benefits and alternatives for the proposed anesthesia with the patient or authorized representative who has indicated his/her understanding and  acceptance.     Plan Discussed with: CRNA  Anesthesia Plan Comments:         Anesthesia Quick Evaluation

## 2016-03-13 ENCOUNTER — Ambulatory Visit (HOSPITAL_COMMUNITY): Payer: 59 | Admitting: Anesthesiology

## 2016-03-13 ENCOUNTER — Encounter (HOSPITAL_COMMUNITY)
Admission: RE | Disposition: A | Discharge status: Home / Self Care | Payer: Self-pay | Source: Ambulatory Visit | Attending: Ophthalmology

## 2016-03-13 ENCOUNTER — Ambulatory Visit (HOSPITAL_COMMUNITY)
Admission: RE | Admit: 2016-03-13 | Discharge: 2016-03-13 | Disposition: A | Discharge status: Home / Self Care | Payer: 59 | Source: Ambulatory Visit | Attending: Ophthalmology | Admitting: Ophthalmology

## 2016-03-13 ENCOUNTER — Encounter (HOSPITAL_COMMUNITY): Payer: Self-pay | Admitting: *Deleted

## 2016-03-13 DIAGNOSIS — F419 Anxiety disorder, unspecified: Secondary | ICD-10-CM | POA: Diagnosis not present

## 2016-03-13 DIAGNOSIS — Z79899 Other long term (current) drug therapy: Secondary | ICD-10-CM | POA: Insufficient documentation

## 2016-03-13 DIAGNOSIS — F329 Major depressive disorder, single episode, unspecified: Secondary | ICD-10-CM | POA: Diagnosis not present

## 2016-03-13 DIAGNOSIS — H268 Other specified cataract: Secondary | ICD-10-CM | POA: Insufficient documentation

## 2016-03-13 HISTORY — PX: CATARACT EXTRACTION W/PHACO: SHX586

## 2016-03-13 SURGERY — PHACOEMULSIFICATION, CATARACT, WITH IOL INSERTION
Anesthesia: Monitor Anesthesia Care | Site: Eye | Laterality: Left

## 2016-03-13 MED ORDER — FENTANYL CITRATE (PF) 100 MCG/2ML IJ SOLN
25.0000 ug | INTRAMUSCULAR | Status: DC | PRN
  Administered 2016-03-13 (×2): 25 ug via INTRAVENOUS
  Filled 2016-03-13: qty 2

## 2016-03-13 MED ORDER — ONDANSETRON HCL 4 MG/2ML IJ SOLN
INTRAMUSCULAR | Status: AC
  Filled 2016-03-13: qty 8

## 2016-03-13 MED ORDER — ONDANSETRON HCL 4 MG/2ML IJ SOLN
INTRAMUSCULAR | Status: DC | PRN
  Administered 2016-03-13: 4 mg via INTRAVENOUS

## 2016-03-13 MED ORDER — PROVISC 10 MG/ML IO SOLN
INTRAOCULAR | Status: DC | PRN
  Administered 2016-03-13: 0.85 mL via INTRAOCULAR

## 2016-03-13 MED ORDER — ONDANSETRON HCL 4 MG/2ML IJ SOLN
4.0000 mg | Freq: Once | INTRAMUSCULAR | Status: AC
  Administered 2016-03-13: 4 mg via INTRAVENOUS
  Filled 2016-03-13: qty 2

## 2016-03-13 MED ORDER — MIDAZOLAM HCL 2 MG/2ML IJ SOLN
1.0000 mg | INTRAMUSCULAR | Status: DC | PRN
  Administered 2016-03-13: 2 mg via INTRAVENOUS
  Filled 2016-03-13: qty 2

## 2016-03-13 MED ORDER — TETRACAINE 0.5 % OP SOLN OPTIME - NO CHARGE
OPHTHALMIC | Status: DC | PRN
  Administered 2016-03-13: 1 [drp] via OPHTHALMIC

## 2016-03-13 MED ORDER — BSS IO SOLN
INTRAOCULAR | Status: DC | PRN
  Administered 2016-03-13: 15 mL

## 2016-03-13 MED ORDER — MIDAZOLAM HCL 2 MG/2ML IJ SOLN
INTRAMUSCULAR | Status: AC
Start: 2016-03-13 — End: 2016-03-13
  Filled 2016-03-13: qty 2

## 2016-03-13 MED ORDER — MIDAZOLAM HCL 2 MG/2ML IJ SOLN
INTRAMUSCULAR | Status: DC | PRN
  Administered 2016-03-13: 2 mg via INTRAVENOUS

## 2016-03-13 MED ORDER — EPINEPHRINE HCL 1 MG/ML IJ SOLN
INTRAOCULAR | Status: DC | PRN
  Administered 2016-03-13: 500 mL

## 2016-03-13 MED ORDER — PHENYLEPHRINE HCL 2.5 % OP SOLN
1.0000 [drp] | OPHTHALMIC | Status: AC
  Administered 2016-03-13 (×3): 1 [drp] via OPHTHALMIC

## 2016-03-13 MED ORDER — TETRACAINE HCL 0.5 % OP SOLN
1.0000 [drp] | OPHTHALMIC | Status: AC
  Administered 2016-03-13 (×3): 1 [drp] via OPHTHALMIC

## 2016-03-13 MED ORDER — KETOROLAC TROMETHAMINE 0.5 % OP SOLN
1.0000 [drp] | OPHTHALMIC | Status: AC
  Administered 2016-03-13 (×3): 1 [drp] via OPHTHALMIC

## 2016-03-13 MED ORDER — LACTATED RINGERS IV SOLN
INTRAVENOUS | Status: DC
  Administered 2016-03-13: 09:00:00 via INTRAVENOUS

## 2016-03-13 MED ORDER — CYCLOPENTOLATE-PHENYLEPHRINE 0.2-1 % OP SOLN
1.0000 [drp] | OPHTHALMIC | Status: AC
  Administered 2016-03-13 (×3): 1 [drp] via OPHTHALMIC

## 2016-03-13 MED ORDER — EPINEPHRINE HCL 1 MG/ML IJ SOLN
INTRAMUSCULAR | Status: AC
  Filled 2016-03-13: qty 1

## 2016-03-13 SURGICAL SUPPLY — 9 items
CLOTH BEACON ORANGE TIMEOUT ST (SAFETY) ×2 IMPLANT
EYE SHIELD UNIVERSAL CLEAR (GAUZE/BANDAGES/DRESSINGS) ×2 IMPLANT
GLOVE BIO SURGEON STRL SZ 6.5 (GLOVE) ×2 IMPLANT
GLOVE EXAM NITRILE MD LF STRL (GLOVE) ×2 IMPLANT
LENS ALC ACRYL/TECN (Ophthalmic Related) ×2 IMPLANT
PAD ARMBOARD 7.5X6 YLW CONV (MISCELLANEOUS) ×2 IMPLANT
TAPE SURG TRANSPORE 1 IN (GAUZE/BANDAGES/DRESSINGS) ×1 IMPLANT
TAPE SURGICAL TRANSPORE 1 IN (GAUZE/BANDAGES/DRESSINGS) ×1
WATER STERILE IRR 250ML POUR (IV SOLUTION) ×2 IMPLANT

## 2016-03-13 NOTE — Op Note (Signed)
Patient brought to the operating room and prepped and draped in the usual manner.  Lid speculum inserted in left eye.  Stab incision made at the twelve o'clock position.  Provisc instilled in the anterior chamber.   A 2.4 mm. Stab incision was made temporally.  An anterior capsulotomy was done with a bent 25 gauge needle.  The nucleus was hydrodissected.  The Phaco tip was inserted in the anterior chamber and the nucleus was emulsified.  CDE was 4.73.  The cortical material was then removed with the I and A tip.  Posterior capsule was the polished.  The anterior chamber was deepened with Provisc.  A 20.0 Diopter Hoya Model 250 IOL was then inserted in the capsular bag.  Provisc was then removed with the I and A tip.  The wound was then hydrated.  Patient sent to the Recovery Room in good condition with follow up in my office.  Preoperative Diagnosis:  Nuclear Cataract OS Postoperative Diagnosis:  Same Procedure name: Kelman Phacoemulsification OS with IOL

## 2016-03-13 NOTE — Anesthesia Procedure Notes (Signed)
Procedure Name: MAC Date/Time: 03/13/2016 9:11 AM Performed by: Vista Deck Pre-anesthesia Checklist: Patient identified, Emergency Drugs available, Suction available, Timeout performed and Patient being monitored Patient Re-evaluated:Patient Re-evaluated prior to inductionOxygen Delivery Method: Nasal Cannula

## 2016-03-13 NOTE — Transfer of Care (Signed)
Immediate Anesthesia Transfer of Care Note  Patient: Crystal Douglas  Procedure(s) Performed: Procedure(s) (LRB): CATARACT EXTRACTION PHACO AND INTRAOCULAR LENS PLACEMENT (IOC) (Left)  Patient Location: Shortstay  Anesthesia Type: MAC  Level of Consciousness: awake  Airway & Oxygen Therapy: Patient Spontanous Breathing   Post-op Assessment: Report given to PACU RN, Post -op Vital signs reviewed and stable and Patient moving all extremities  Post vital signs: Reviewed and stable  Complications: No apparent anesthesia complications

## 2016-03-13 NOTE — Discharge Instructions (Signed)
°  °          [facility] Instructions °[address]- [place] [address]- °    ° °1. Avoid closing eyes tightly. One often closes the eye tightly when laughing, talking, sneezing, coughing or if they feel irritated. At these times, you should be careful not to close your eyes tightly. ° °2. Instill eye drops as instructed. To instill drops in your eye, open it, look up and have someone gently pull the lower lid down and instill a couple of drops inside the lower lid. ° °3. Do not touch upper lid. ° °4. Take Advil or Tylenol for pain. ° °5. You may use either eye for near work, such as reading or sewing and you may watch television. ° °6. You may have your hair done at the beauty parlor at any time. ° °7. Wear dark glasses with or without your own glasses if you are in bright light. ° °8. Call our office at [phone] or [phone] if you have sharp pain in your eye or unusual symptoms. ° °9.  FOLLOW UP WITH DR. SHAPIRO TODAY IN HIS [place] OFFICE AT 2:45pm. ° °  °I have received a copy of the above instructions and will follow them.  ° ° ° °IF YOU ARE IN IMMEDIATE DANGER CALL 911! ° °It is important for you to keep your follow-up appointment with your physician after discharge, OR, for you /your caregiver to make a follow-up appointment with your physician / medical provider after discharge. ° °Show these instructions to the next healthcare provider you see. ° °

## 2016-03-13 NOTE — Anesthesia Postprocedure Evaluation (Signed)
  Anesthesia Post-op Note  Patient: Crystal Douglas  Procedure(s) Performed: Procedure(s) (LRB): CATARACT EXTRACTION PHACO AND INTRAOCULAR LENS PLACEMENT (IOC) (Left)  Patient Location:  Short Stay  Anesthesia Type: MAC  Level of Consciousness: awake  Airway and Oxygen Therapy: Patient Spontanous Breathing  Post-op Pain: none  Post-op Assessment: Post-op Vital signs reviewed, Patient's Cardiovascular Status Stable, Respiratory Function Stable, Patent Airway, No signs of Nausea or vomiting and Pain level controlled  Post-op Vital Signs: Reviewed and stable  Complications: No apparent anesthesia complications

## 2016-03-13 NOTE — H&P (Signed)
The patient was re examined and there is no change in the patients condition since the original H and P. 

## 2016-03-14 ENCOUNTER — Encounter (HOSPITAL_COMMUNITY): Payer: Self-pay | Admitting: Ophthalmology

## 2019-03-24 ENCOUNTER — Encounter (INDEPENDENT_AMBULATORY_CARE_PROVIDER_SITE_OTHER): Payer: Self-pay | Admitting: *Deleted

## 2019-06-30 ENCOUNTER — Other Ambulatory Visit: Payer: Self-pay | Admitting: *Deleted

## 2019-06-30 DIAGNOSIS — Z20822 Contact with and (suspected) exposure to covid-19: Secondary | ICD-10-CM

## 2019-07-02 LAB — NOVEL CORONAVIRUS, NAA: SARS-CoV-2, NAA: NOT DETECTED

## 2019-08-31 ENCOUNTER — Emergency Department (HOSPITAL_COMMUNITY): Payer: BC Managed Care – PPO

## 2019-08-31 ENCOUNTER — Other Ambulatory Visit: Payer: Self-pay

## 2019-08-31 ENCOUNTER — Emergency Department (HOSPITAL_COMMUNITY)
Admission: EM | Admit: 2019-08-31 | Discharge: 2019-08-31 | Disposition: A | Discharge status: Home / Self Care | Payer: BC Managed Care – PPO | Attending: Emergency Medicine | Admitting: Emergency Medicine

## 2019-08-31 ENCOUNTER — Encounter (HOSPITAL_COMMUNITY): Payer: Self-pay | Admitting: Emergency Medicine

## 2019-08-31 DIAGNOSIS — Y9301 Activity, walking, marching and hiking: Secondary | ICD-10-CM | POA: Diagnosis not present

## 2019-08-31 DIAGNOSIS — Y999 Unspecified external cause status: Secondary | ICD-10-CM | POA: Insufficient documentation

## 2019-08-31 DIAGNOSIS — S82831A Other fracture of upper and lower end of right fibula, initial encounter for closed fracture: Secondary | ICD-10-CM | POA: Insufficient documentation

## 2019-08-31 DIAGNOSIS — F17200 Nicotine dependence, unspecified, uncomplicated: Secondary | ICD-10-CM | POA: Insufficient documentation

## 2019-08-31 DIAGNOSIS — S8991XA Unspecified injury of right lower leg, initial encounter: Secondary | ICD-10-CM | POA: Diagnosis present

## 2019-08-31 DIAGNOSIS — S82839A Other fracture of upper and lower end of unspecified fibula, initial encounter for closed fracture: Secondary | ICD-10-CM

## 2019-08-31 DIAGNOSIS — Y929 Unspecified place or not applicable: Secondary | ICD-10-CM | POA: Diagnosis not present

## 2019-08-31 DIAGNOSIS — Z79899 Other long term (current) drug therapy: Secondary | ICD-10-CM | POA: Insufficient documentation

## 2019-08-31 DIAGNOSIS — X501XXA Overexertion from prolonged static or awkward postures, initial encounter: Secondary | ICD-10-CM | POA: Insufficient documentation

## 2019-08-31 MED ORDER — TRAMADOL HCL 50 MG PO TABS: 50.0000 mg | ORAL_TABLET | Freq: Four times a day (QID) | ORAL | 0 refills | Status: DC | PRN

## 2019-08-31 MED ORDER — TRAMADOL HCL 50 MG PO TABS
50.0000 mg | ORAL_TABLET | Freq: Once | ORAL | Status: AC
  Administered 2019-08-31: 50 mg via ORAL
  Filled 2019-08-31: qty 1

## 2019-08-31 NOTE — ED Triage Notes (Signed)
Pt states she rolled her right ankle last night and it is very swollen.

## 2019-08-31 NOTE — ED Provider Notes (Signed)
[facility] Provider Note   CSN: [id number] Arrival date & time: 08/31/19  1248     History Chief Complaint  Patient presents with  . Ankle Pain    Crystal Douglas is a 63 y.o. female.  HPI      Crystal Douglas is a 63 y.o. female who presents to the Emergency Department complaining of right ankle pain and swelling after mechanical fall that occurred yesterday.  She states that she slipped off a step" rolled" her ankle.  She notes pain to the lateral aspect of her ankle that is worse with flexion and with weightbearing.  Pain improves somewhat at rest.  She woke this morning with throbbing pain and increased swelling.  She denies head injury, LOC, neck or back pain, hip or knee pain.  No numbness or tingling of the extremities.  She has not tried any therapies prior to arrival.    Past Medical History:  Diagnosis Date  . Anxiety     There are no problems to display for this patient.   Past Surgical History:  Procedure Laterality Date  . CATARACT EXTRACTION W/PHACO Right 02/21/2016   Procedure: CATARACT EXTRACTION PHACO AND INTRAOCULAR LENS PLACEMENT (IOC);  Surgeon: Rutherford Guys, MD;  Location: [facility];  Service: Ophthalmology;  Laterality: Right;  CDE:3.66  . CATARACT EXTRACTION W/PHACO Left 03/13/2016   Procedure: CATARACT EXTRACTION PHACO AND INTRAOCULAR LENS PLACEMENT (IOC);  Surgeon: Rutherford Guys, MD;  Location: [facility];  Service: Ophthalmology;  Laterality: Left;  CDE: 4.73                    . COLONOSCOPY N/A 03/11/2014   Procedure: COLONOSCOPY;  Surgeon: Rogene Houston, MD;  Location: [facility];  Service: Endoscopy;  Laterality: N/A;  1030  . TONSILLECTOMY     @63  years of age     OB History   No obstetric history on file.     Family History  Problem Relation Age of Onset  . Colon cancer Father   . Colon cancer Other     Social History   Tobacco Use  . Smoking status: Current Every Day Smoker    Packs/day: 1.50    Substance Use Topics  . Alcohol use: No  . Drug use: No    Home Medications Prior to Admission medications   Medication Sig Start Date End Date Taking? Authorizing Provider  ALPRAZolam Duanne Moron) 1 MG tablet Take 2 mg by mouth at bedtime.    [provider]  escitalopram (LEXAPRO) 5 MG tablet Take 2.5 mg by mouth daily.    [provider]  traMADol (ULTRAM) 50 MG tablet Take 1 tablet (50 mg total) by mouth every 6 (six) hours as needed. 08/31/19   Kamaal Cast, PA-C    Allergies    Codeine, Lactose intolerance (gi), Penicillins, Shellfish allergy, and Tetracyclines & related  Review of Systems   Review of Systems  Constitutional: Negative for chills and fever.  Musculoskeletal: Positive for arthralgias (Right ankle pain and swelling) and joint swelling. Negative for back pain and neck pain.  Skin: Negative for color change and wound.  Neurological: Negative for dizziness, syncope, weakness and headaches.    Physical Exam Updated Vital Signs BP 111/72 (BP Location: Right Arm)   Pulse 71   Temp 98.1 F (36.7 C) (Oral)   Resp 16   SpO2 100%   Physical Exam Vitals and nursing note reviewed.  Constitutional:      General: She  is not in acute distress.    Appearance: She is well-developed.  HENT:     Head: Atraumatic.  Cardiovascular:     Rate and Rhythm: Normal rate and regular rhythm.     Pulses: Normal pulses.  Pulmonary:     Effort: Pulmonary effort is normal.     Breath sounds: Normal breath sounds.  Chest:     Chest wall: No tenderness.  Musculoskeletal:        General: Swelling, tenderness and signs of injury present. No deformity.     Comments: Focal tenderness to palpation of the lateral aspect of the right ankle.  Moderate edema noted.  No proximal tenderness or pain with movement of the toes.  No bony deformity.  Skin:    General: Skin is warm and dry.  Neurological:     General: No focal deficit present.     Mental Status: She is alert.      Sensory: No sensory deficit.     Motor: No weakness or abnormal muscle tone.     ED Results / Procedures / Treatments   Labs (all labs ordered are listed, but only abnormal results are displayed) Labs Reviewed - No data to display  EKG None  Radiology DG Ankle Complete Right  Result Date: 08/31/2019 CLINICAL DATA:  Twisting injury with lateral pain and swelling EXAM: RIGHT ANKLE - COMPLETE 3+ VIEW COMPARISON:  None. FINDINGS: Lateral soft tissue swelling. Avulsion fracture of the tip of the fibula. No large fracture. No widening of the ankle mortise. No other regional abnormal finding. IMPRESSION: Avulsion fracture of the tip of the fibula. Lateral soft tissue swelling. Electronically Signed   By: Nelson Chimes M.D.   On: 08/31/2019 13:51    Procedures Procedures (including critical care time)  Medications Ordered in ED Medications  traMADol (ULTRAM) tablet 50 mg (50 mg Oral Given 08/31/19 1430)    ED Course  I have reviewed the triage vital signs and the nursing notes.  Pertinent labs & imaging results that were available during my care of the patient were reviewed by me and considered in my medical decision making (see chart for details).    MDM Rules/Calculators/A&P                       Patient with mechanical injury to the right ankle yesterday.  She is neurovascularly intact.  X-ray of the right ankle shows avulsion fracture of the distal tip of the fibula.  Patient will be placed in a Aircast and crutches given.  She prefers local orthopedic follow-up, referral information provided for Dr. Ruthe Mannan office.  RICE instructions discussed.     Final Clinical Impression(s) / ED Diagnoses Final diagnoses:  Avulsion fracture of distal fibula    Rx / DC Orders ED Discharge Orders         Ordered    traMADol (ULTRAM) 50 MG tablet  Every 6 hours PRN     08/31/19 1413           Kem Parkinson, PA-C 08/31/19 1441    Dorie Rank, MD 09/01/19 1313

## 2019-08-31 NOTE — Discharge Instructions (Addendum)
Elevate and apply ice packs on and off to your ankle.  Use your crutches for weightbearing.  Call Dr. Ruthe Mannan office to arrange a follow-up appointment.  You may also take ibuprofen 600 mg 3 times a day with food.

## 2019-09-02 ENCOUNTER — Encounter: Payer: Self-pay | Admitting: Orthopedic Surgery

## 2019-09-02 ENCOUNTER — Ambulatory Visit: Payer: BC Managed Care – PPO | Admitting: Orthopedic Surgery

## 2019-09-02 ENCOUNTER — Other Ambulatory Visit: Payer: Self-pay

## 2019-09-02 VITALS — BP 94/61 | HR 80 | Ht 65.0 in | Wt 148.0 lb

## 2019-09-02 DIAGNOSIS — S93401A Sprain of unspecified ligament of right ankle, initial encounter: Secondary | ICD-10-CM

## 2019-09-02 DIAGNOSIS — S93409A Sprain of unspecified ligament of unspecified ankle, initial encounter: Secondary | ICD-10-CM

## 2019-09-02 MED ORDER — TRAMADOL HCL 50 MG PO TABS: 50.0000 mg | ORAL_TABLET | Freq: Four times a day (QID) | ORAL | 0 refills | Status: AC | PRN

## 2019-09-02 NOTE — Patient Instructions (Addendum)
AIR CAST X 3  WEEKS   ANKLE EXERCISES   ICE FOR SWELLING   OTC IBUPROFEN 400 MG 3 TIMES A DAY   TYLENOL 500 MG 4 X A DAY    Ankle Sprain, Phase I Rehab An ankle sprain is an injury to the ligaments of your ankle. Ankle sprains cause stiffness, loss of motion, and loss of strength. Ask your health care provider which exercises are safe for you. Do exercises exactly as told by your health care provider and adjust them as directed. It is normal to feel mild stretching, pulling, tightness, or discomfort as you do these exercises. Stop right away if you feel sudden pain or your pain gets worse. Do not begin these exercises until told by your health care provider. Ankle alphabet  1. Sit with your left / right leg supported at the lower leg. ? Do not rest your foot on anything. ? Make sure your foot has room to move freely. 2. Think of your left / right foot as a paintbrush. ? Move your foot to trace each letter of the alphabet in the air. Keep your hip and knee still while you trace. ? Make the letters as large as you can without feeling discomfort. 3. Trace every letter from A to Z. Repeat __________ times. Complete this exercise __________ times a day.

## 2019-09-02 NOTE — Progress Notes (Signed)
Patient ID: Crystal Douglas, female   DOB: 18-Sep-1955, 63 y.o.   MRN: [id number]  Chief Complaint  Patient presents with  . Ankle Injury    right ankle injury 08/30/19     HPI Crystal Douglas is a 63 y.o. female.  Who presents for evaluation of right ankle avulsion fracture lateral malleolus after stepping wrong and twisting her ankle.  Initially had severe pain seems to be better today  Location right ankle pain  Duration 3 days  Quality dull ache  Severity moderate  Associated with swelling and wb pain    Review of Systems Review of Systems  Skin:       Itching   All other systems reviewed and are negative.    Past Medical History:  Diagnosis Date  . Anxiety     Past Surgical History:  Procedure Laterality Date  . CATARACT EXTRACTION W/PHACO Right 02/21/2016   Procedure: CATARACT EXTRACTION PHACO AND INTRAOCULAR LENS PLACEMENT (IOC);  Surgeon: Rutherford Guys, MD;  Location: [facility];  Service: Ophthalmology;  Laterality: Right;  CDE:3.66  . CATARACT EXTRACTION W/PHACO Left 03/13/2016   Procedure: CATARACT EXTRACTION PHACO AND INTRAOCULAR LENS PLACEMENT (IOC);  Surgeon: Rutherford Guys, MD;  Location: [facility];  Service: Ophthalmology;  Laterality: Left;  CDE: 4.73                    . COLONOSCOPY N/A 03/11/2014   Procedure: COLONOSCOPY;  Surgeon: Rogene Houston, MD;  [facility];  Service: Endoscopy;  Laterality: N/A;  1030  . TONSILLECTOMY     @63  years of age    Family History  Problem Relation Age of Onset  . Colon cancer Father   . Cancer Father        prostate   . Colon cancer Other     Social History Social History   Tobacco Use  . Smoking status: Current Every Day Smoker    Packs/day: 1.50  . Smokeless tobacco: Never Used  Substance Use Topics  . Alcohol use: No  . Drug use: No    Allergies  Allergen Reactions  . Codeine Other (See Comments)    Patient states "things were crawling on me".  . Lactose Intolerance (Gi) Hives  .  Penicillins Hives      . Shellfish Allergy Nausea And Vomiting  . Tetracyclines & Related Hives    Current Outpatient Medications  Medication Sig Dispense Refill  . ALPRAZolam (XANAX) 1 MG tablet Take 2 mg by mouth at bedtime.    Marland Kitchen escitalopram (LEXAPRO) 5 MG tablet Take 2.5 mg by mouth daily.    . traMADol (ULTRAM) 50 MG tablet Take 1 tablet (50 mg total) by mouth every 6 (six) hours as needed for up to 5 days. 20 tablet 0   No current facility-administered medications for this visit.       Physical Exam BP 94/61   Pulse 80   Ht 5\' 5"  (1.651 m)   Wt 148 lb (67.1 kg)   BMI 24.63 kg/m  Physical Exam The patient is well developed well nourished and well groomed.  Orientation to person place and time is normal  Mood is pleasant.  Ambulatory status she is weightbearing with a limp without assistive device Ortho Exam  Right ankle examination: Inspection reveals tenderness and swelling over the lateral malleolus. The medial malleolus is minimal  Range of motion is limited by pain foot is plantigrade. Ankle stability tests were deferred because of pain in the  ankle does not appear to be subluxated. Motor exam shows no atrophy  Skin shows no  bruising and ecchymosis.  However, there is an abrasion on the shin area neurovascular exam is intact.  Opposite ankle shows no deformity. ROM is normal       MEDICAL DECISION SECTION  Prior outside images have been reviewed and the report is included by reference.  It confirms the fracture.  I have also read the x-ray and give the following personal interpretation of the films:  Avulsion fracture distal fibula best seen on the [facility] x-ray  Any prior office visits and ER records have been reviewed and confirmed the diagnosis of ankle fracture   Encounter Diagnosis  Name Primary?  Marland Kitchen Avulsion of ligament with bony fragment of lateral malleolus Yes     PLAN:   Continue Aircast for 3 weeks Continue ice to control swelling Take  Tylenol and Advil instructions given Okay to use tramadol for 4-5 more days Follow-up as needed  Meds ordered this encounter  Medications  . traMADol (ULTRAM) 50 MG tablet    Sig: Take 1 tablet (50 mg total) by mouth every 6 (six) hours as needed for up to 5 days.    Dispense:  20 tablet    Refill:  0

## 2019-11-22 ENCOUNTER — Ambulatory Visit: Payer: BC Managed Care – PPO | Attending: Internal Medicine

## 2019-11-22 DIAGNOSIS — Z23 Encounter for immunization: Secondary | ICD-10-CM

## 2019-11-22 NOTE — Progress Notes (Signed)
   Covid-19 Vaccination Clinic  Name:  EMLYNN DRISKILL    MRN: [id number] DOB: 04-15-1956  11/22/2019  Ms. Nazzaro was observed post Covid-19 immunization for 15 minutes without incident. She was provided with Vaccine Information Sheet and instruction to access the V-Safe system.   Ms. Rood was instructed to call 911 with any severe reactions post vaccine: Marland Kitchen Difficulty breathing  . Swelling of face and throat  . A fast heartbeat  . A bad rash all over body  . Dizziness and weakness   Immunizations Administered    Name Date Dose VIS Date Route   Pfizer COVID-19 Vaccine 11/22/2019  4:29 PM 0.3 mL 08/28/2019 Intramuscular   Manufacturer: [place]   Lot: [id number]   [place]: [id number]

## 2019-12-13 ENCOUNTER — Ambulatory Visit: Payer: BC Managed Care – PPO | Attending: Internal Medicine

## 2019-12-13 DIAGNOSIS — Z23 Encounter for immunization: Secondary | ICD-10-CM

## 2019-12-13 NOTE — Progress Notes (Signed)
   Covid-19 Vaccination Clinic  Name:  Crystal Douglas    MRN: [id number] DOB: [birth year]  12/13/2019  Crystal Douglas was observed post Covid-19 immunization for 15 minutes .  During the observation period, she experienced an adverse reaction with the following symptoms:  dizziness. After 15 minute observation period pt informed nurse of slight dizziness and thinks its anxiety. She was evaluated by EMS and per EMS is okay to be discharged home. Vitals were WNL and pt was alert, oriented, and ambulatory at discharge.   Assessment : Time of assessment 1503. Alert and oriented.  Actions taken:  Vitals sign taken  Vitals WNL   Medications administered: No medication administered.  Disposition: Reports no further symptoms of adverse reaction after observation for 15 minutes. Discharged home. Instructed to call 911 for trouble breathing, rapid heart rate, dizziness, swelling of tongue or throat.   Immunizations Administered    Name Date Dose VIS Date Route   Pfizer COVID-19 Vaccine 12/13/2019  2:44 PM 0.3 mL 08/28/2019 Intramuscular   Manufacturer: [place]   Lot: [id number]   [place]: [id number]

## 2019-12-15 ENCOUNTER — Telehealth: Payer: Self-pay | Admitting: *Deleted

## 2019-12-15 NOTE — Telephone Encounter (Signed)
Called and spoke with Crystal Douglas in regards to her COVID vaccine that she received on 12/13/2019. Patient states that she did not have any more dizziness after leaving the site where she received her vaccine. She states that she did not have any other symptoms or side effects after the vaccine and feels great.

## 2019-12-15 NOTE — Telephone Encounter (Signed)
Sounds good.  I think she is fine to go ahead and get the second vaccine.  Salvatore Marvel, MD [facility] [place] [facility] [place] [facility]

## 2020-05-25 ENCOUNTER — Encounter (INDEPENDENT_AMBULATORY_CARE_PROVIDER_SITE_OTHER): Payer: Self-pay | Admitting: *Deleted

## 2020-09-15 ENCOUNTER — Ambulatory Visit: Payer: BC Managed Care – PPO | Attending: Internal Medicine

## 2020-09-15 DIAGNOSIS — Z23 Encounter for immunization: Secondary | ICD-10-CM

## 2020-09-15 NOTE — Progress Notes (Signed)
   Covid-19 Vaccination Clinic  Name:  MIESHIA PEPITONE    MRN: [id number] DOB: 1956/03/31  09/15/2020  Ms. Howden was observed post Covid-19 immunization for 15 minutes without incident. She was provided with Vaccine Information Sheet and instruction to access the V-Safe system.   Ms. Schmoker was instructed to call 911 with any severe reactions post vaccine: Marland Kitchen Difficulty breathing  . Swelling of face and throat  . A fast heartbeat  . A bad rash all over body  . Dizziness and weakness   Immunizations Administered    Name Date Dose VIS Date Route   Pfizer COVID-19 Vaccine 09/15/2020  1:43 PM 0.3 mL 07/06/2020 Intramuscular   Manufacturer: [employer]   Lot: [id number]   [place]: [address]-0

## 2020-09-19 ENCOUNTER — Other Ambulatory Visit (HOSPITAL_COMMUNITY): Payer: Self-pay | Admitting: Internal Medicine

## 2020-09-19 DIAGNOSIS — Z1231 Encounter for screening mammogram for malignant neoplasm of breast: Secondary | ICD-10-CM

## 2020-10-11 ENCOUNTER — Other Ambulatory Visit: Payer: Self-pay | Admitting: Internal Medicine

## 2020-10-11 DIAGNOSIS — R319 Hematuria, unspecified: Secondary | ICD-10-CM

## 2020-11-03 ENCOUNTER — Ambulatory Visit (HOSPITAL_COMMUNITY): Payer: BC Managed Care – PPO

## 2020-11-09 ENCOUNTER — Other Ambulatory Visit: Payer: Self-pay

## 2020-11-09 ENCOUNTER — Ambulatory Visit (HOSPITAL_COMMUNITY)
Admission: RE | Admit: 2020-11-09 | Discharge: 2020-11-09 | Disposition: A | Discharge status: Home / Self Care | Payer: BC Managed Care – PPO | Source: Ambulatory Visit | Attending: Internal Medicine | Admitting: Internal Medicine

## 2020-11-09 DIAGNOSIS — R319 Hematuria, unspecified: Secondary | ICD-10-CM | POA: Diagnosis not present

## 2020-11-09 LAB — POCT I-STAT CREATININE: Creatinine, Ser: 0.7 mg/dL (ref 0.44–1.00)

## 2020-11-09 MED ORDER — IOHEXOL 300 MG/ML  SOLN
125.0000 mL | Freq: Once | INTRAMUSCULAR | Status: AC | PRN
  Administered 2020-11-09: 125 mL via INTRAVENOUS

## 2020-11-30 ENCOUNTER — Ambulatory Visit (HOSPITAL_COMMUNITY)
Admission: RE | Admit: 2020-11-30 | Discharge: 2020-11-30 | Disposition: A | Discharge status: Home / Self Care | Payer: BC Managed Care – PPO | Source: Ambulatory Visit | Attending: Internal Medicine | Admitting: Internal Medicine

## 2020-11-30 DIAGNOSIS — Z1231 Encounter for screening mammogram for malignant neoplasm of breast: Secondary | ICD-10-CM | POA: Insufficient documentation

## 2020-12-05 ENCOUNTER — Other Ambulatory Visit (HOSPITAL_COMMUNITY): Payer: Self-pay | Admitting: Internal Medicine

## 2020-12-05 DIAGNOSIS — R928 Other abnormal and inconclusive findings on diagnostic imaging of breast: Secondary | ICD-10-CM

## 2020-12-06 ENCOUNTER — Ambulatory Visit (HOSPITAL_COMMUNITY)
Admission: RE | Admit: 2020-12-06 | Discharge: 2020-12-06 | Disposition: A | Discharge status: Home / Self Care | Payer: BC Managed Care – PPO | Source: Ambulatory Visit | Attending: Internal Medicine | Admitting: Internal Medicine

## 2020-12-06 DIAGNOSIS — R928 Other abnormal and inconclusive findings on diagnostic imaging of breast: Secondary | ICD-10-CM | POA: Diagnosis not present

## 2020-12-08 ENCOUNTER — Other Ambulatory Visit: Payer: Self-pay | Admitting: *Deleted

## 2020-12-08 ENCOUNTER — Other Ambulatory Visit: Payer: Self-pay | Admitting: Diagnostic Radiology

## 2020-12-08 DIAGNOSIS — R928 Other abnormal and inconclusive findings on diagnostic imaging of breast: Secondary | ICD-10-CM

## 2020-12-08 DIAGNOSIS — R921 Mammographic calcification found on diagnostic imaging of breast: Secondary | ICD-10-CM

## 2020-12-16 ENCOUNTER — Ambulatory Visit
Admission: RE | Admit: 2020-12-16 | Discharge: 2020-12-16 | Disposition: A | Discharge status: Home / Self Care | Payer: BC Managed Care – PPO | Source: Ambulatory Visit | Attending: Diagnostic Radiology | Admitting: Diagnostic Radiology

## 2020-12-16 ENCOUNTER — Ambulatory Visit
Admission: RE | Admit: 2020-12-16 | Discharge: 2020-12-16 | Disposition: A | Discharge status: Home / Self Care | Payer: BC Managed Care – PPO | Source: Ambulatory Visit | Attending: Internal Medicine | Admitting: Internal Medicine

## 2020-12-16 ENCOUNTER — Other Ambulatory Visit: Payer: Self-pay | Admitting: Internal Medicine

## 2020-12-16 ENCOUNTER — Other Ambulatory Visit: Payer: Self-pay

## 2020-12-16 DIAGNOSIS — R928 Other abnormal and inconclusive findings on diagnostic imaging of breast: Secondary | ICD-10-CM

## 2020-12-16 DIAGNOSIS — R921 Mammographic calcification found on diagnostic imaging of breast: Secondary | ICD-10-CM

## 2020-12-20 ENCOUNTER — Other Ambulatory Visit (INDEPENDENT_AMBULATORY_CARE_PROVIDER_SITE_OTHER): Payer: Self-pay | Admitting: *Deleted

## 2020-12-20 ENCOUNTER — Encounter (INDEPENDENT_AMBULATORY_CARE_PROVIDER_SITE_OTHER): Payer: Self-pay | Admitting: *Deleted

## 2020-12-20 ENCOUNTER — Other Ambulatory Visit: Payer: Self-pay

## 2020-12-20 ENCOUNTER — Ambulatory Visit (INDEPENDENT_AMBULATORY_CARE_PROVIDER_SITE_OTHER): Payer: BC Managed Care – PPO | Admitting: Internal Medicine

## 2020-12-20 ENCOUNTER — Encounter (INDEPENDENT_AMBULATORY_CARE_PROVIDER_SITE_OTHER): Payer: Self-pay | Admitting: Internal Medicine

## 2020-12-20 DIAGNOSIS — R197 Diarrhea, unspecified: Secondary | ICD-10-CM | POA: Diagnosis not present

## 2020-12-20 DIAGNOSIS — Z86010 Personal history of colonic polyps: Secondary | ICD-10-CM | POA: Diagnosis not present

## 2020-12-20 NOTE — Progress Notes (Signed)
Presenting complaint;  Change in bowel habits.  Database and subjective  Patient is 65 year old Caucasian female who is here for scheduled visit.  She has family history of colon carcinoma in her father and personal history of colonic adenomas.  Her last colonoscopy was in April 2015.  She was advised to return for follow-up exam in 5 years.  She was sent a letter in July 2020 and again in September 2021 but she did not respond. She states she used to be constipated but now she is having diarrhea.  She believes change occurred over a year ago.  She is having 1-2 stools per day but her stools are loose.  She denies  melena or rectal bleeding.  She has noticed some abdominal pain which she believes is due to exercises that she is doing to strengthen abdominal muscles.  Her appetite is good and she has not lost any weight recently. She denies heartburn nausea vomiting or dysphagia. She states he is very active.  She goes to the [facility] 4 times a week. She she drinks alcohol occasionally but continues to smoke cigarettes.  She smokes 1 pack/day.  Family history is positive for colon carcinoma in her father was 60 at the time of diagnosis and lived to be in his 65s.  She believes her paternal grand mother also had colon carcinoma.  Current Medications: Outpatient Encounter Medications as of 12/20/2020  Medication Sig  . ALPRAZolam (XANAX) 1 MG tablet Take 2 mg by mouth at bedtime.  Marland Kitchen escitalopram (LEXAPRO) 5 MG tablet Take 2.5 mg by mouth daily.   No facility-administered encounter medications on file as of 12/20/2020.   Past Medical History:  Diagnosis Date  . Anxiety       History of colonic adenomas.  Last colonoscopy was in April 2015.  Past Surgical History:  Procedure Laterality Date  . CATARACT EXTRACTION W/PHACO Right 02/21/2016   Procedure: CATARACT EXTRACTION PHACO AND INTRAOCULAR LENS PLACEMENT (IOC);  Surgeon: Rutherford Guys, MD;  Location: [facility];  Service: Ophthalmology;  Laterality:  Right;  CDE:3.66  . CATARACT EXTRACTION W/PHACO Left 03/13/2016   Procedure: CATARACT EXTRACTION PHACO AND INTRAOCULAR LENS PLACEMENT (IOC);  Surgeon: Rutherford Guys, MD;  Location: [facility];  Service: Ophthalmology;  Laterality: Left;  CDE:   . COLONOSCOPY N/A 03/11/2014   Procedure: COLONOSCOPY;  Surgeon: Rogene Houston, MD;  [facility];  Service: Endoscopy;  Laterality: N/A;  1030  . TONSILLECTOMY     @65  years of age        Right breast biopsy benign.  Allergies  Allergies  Allergen Reactions  . Codeine Other (See Comments)    Patient states "things were crawling on me".  . Lactose Intolerance (Gi) Hives  . Penicillins Hives      . Shellfish Allergy Nausea And Vomiting  . Tetracyclines & Related Hives    Objective: Blood pressure (!) 103/58, pulse (!) 57, temperature 97.7 F (36.5 C), temperature source Oral, height 5\' 5"  (1.651 m), weight 144 lb 1.6 oz (65.4 kg). Patient is alert and in no acute distress. She is wearing a mask. Conjunctiva is pink. Sclera is nonicteric Oropharyngeal mucosa is normal. No neck masses or thyromegaly noted. Cardiac exam with regular rhythm normal S1 and S2. No murmur or gallop noted. Lungs are clear to auscultation. Abdomen is symmetrical soft and nontender with organomegaly or masses. No LE edema or clubbing noted.   Assessment:  #1.  Change in bowel habits.  She used to be constipated  but now she has developed diarrhea.  She does not have alarm symptoms.  I suspect she may have irritable bowel syndrome or she could have microscopic colitis.  #2.  History of colonic adenomas.  Last colonoscopy was 7 years ago.  She is due for surveillance colonoscopy.  Plan:  Surveillance colonoscopy in near future. We will consider random biopsies to rule out microscopic colitis. Further recommendations to follow.

## 2020-12-20 NOTE — Patient Instructions (Signed)
Colonoscopy to be scheduled in near.

## 2020-12-21 ENCOUNTER — Other Ambulatory Visit (INDEPENDENT_AMBULATORY_CARE_PROVIDER_SITE_OTHER): Payer: Self-pay

## 2020-12-21 DIAGNOSIS — Z86010 Personal history of colonic polyps: Secondary | ICD-10-CM

## 2020-12-21 DIAGNOSIS — R197 Diarrhea, unspecified: Secondary | ICD-10-CM

## 2020-12-27 NOTE — Patient Instructions (Signed)
Crystal Douglas  12/27/2020     @PREFPERIOPPHARMACY @   Your procedure is scheduled on  01/04/2021.   Report to [facility] at  0830  A.M.   Call this number if you have problems the morning of surgery:  [phone]   Remember:  Follow the diet and prep instructions given to you by the office.                     Take these medicines the morning of surgery with A SIP OF WATER  Lexapro.     Please brush your teeth.  Do not wear jewelry, make-up or nail polish.  Do not wear lotions, powders, or perfumes, or deodorant.  Do not shave 48 hours prior to surgery.  Men may shave face and neck.  Do not bring valuables to the hospital.  [facility] is not responsible for any belongings or valuables.   Contacts, dentures or bridgework may not be worn into surgery.  Leave your suitcase in the car.  After surgery it may be brought to your room.  For patients admitted to the hospital, discharge time will be determined by your treatment team.  Patients discharged the day of surgery will not be allowed to drive home and must have someone with them for 24 hours.   Special instructions:  DO NOT smoke tobacco or vape for 24 hours before your procedure.  Please read over the following fact sheets that you were given. Anesthesia Post-op Instructions and Care and Recovery After Surgery       Colonoscopy, Adult, Care After This sheet gives you information about how to care for yourself after your procedure. Your health care provider may also give you more specific instructions. If you have problems or questions, contact your health care provider. What can I expect after the procedure? After the procedure, it is common to have:  A small amount of blood in your stool for 24 hours after the procedure.  Some gas.  Mild cramping or bloating of your abdomen. Follow these instructions at home: Eating and drinking  Drink enough fluid to keep your urine pale yellow.  Follow  instructions from your health care provider about eating or drinking restrictions.  Resume your normal diet as instructed by your health care provider. Avoid heavy or fried foods that are hard to digest.   Activity  Rest as told by your health care provider.  Avoid sitting for a long time without moving. Get up to take short walks every 1-2 hours. This is important to improve blood flow and breathing. Ask for help if you feel weak or unsteady.  Return to your normal activities as told by your health care provider. Ask your health care provider what activities are safe for you. Managing cramping and bloating  Try walking around when you have cramps or feel bloated.  Apply heat to your abdomen as told by your health care provider. Use the heat source that your health care provider recommends, such as a moist heat pack or a heating pad. ? Place a towel between your skin and the heat source. ? Leave the heat on for 20-30 minutes. ? Remove the heat if your skin turns bright red. This is especially important if you are unable to feel pain, heat, or cold. You may have a greater risk of getting burned.   General instructions  If you were given a sedative during the procedure, it can affect you  for several hours. Do not drive or operate machinery until your health care provider says that it is safe.  For the first 24 hours after the procedure: ? Do not sign important documents. ? Do not drink alcohol. ? Do your regular daily activities at a slower pace than normal. ? Eat soft foods that are easy to digest.  Take over-the-counter and prescription medicines only as told by your health care provider.  Keep all follow-up visits as told by your health care provider. This is important. Contact a health care provider if:  You have blood in your stool 2-3 days after the procedure. Get help right away if you have:  More than a small spotting of blood in your stool.  Large blood clots in your  stool.  Swelling of your abdomen.  Nausea or vomiting.  A fever.  Increasing pain in your abdomen that is not relieved with medicine. Summary  After the procedure, it is common to have a small amount of blood in your stool. You may also have mild cramping and bloating of your abdomen.  If you were given a sedative during the procedure, it can affect you for several hours. Do not drive or operate machinery until your health care provider says that it is safe.  Get help right away if you have a lot of blood in your stool, nausea or vomiting, a fever, or increased pain in your abdomen. This information is not intended to replace advice given to you by your health care provider. Make sure you discuss any questions you have with your health care provider. Document Revised: 08/28/2019 Document Reviewed: 03/30/2019 Elsevier Patient Education  2021 [employer] This sheet gives you information about how to care for yourself after your procedure. Your health care provider may also give you more specific instructions. If you have problems or questions, contact your health care provider. What can I expect after the procedure? After the procedure, it is common to have:  Tiredness.  Forgetfulness about what happened after the procedure.  Impaired judgment for important decisions.  Nausea or vomiting.  Some difficulty with balance. Follow these instructions at home: For the time period you were told by your health care provider:  Rest as needed.  Do not participate in activities where you could fall or become injured.  Do not drive or use machinery.  Do not drink alcohol.  Do not take sleeping pills or medicines that cause drowsiness.  Do not make important decisions or sign legal documents.  Do not take care of children on your own.      Eating and drinking  Follow the diet that is recommended by your health care provider.  Drink  enough fluid to keep your urine pale yellow.  If you vomit: ? Drink water, juice, or soup when you can drink without vomiting. ? Make sure you have little or no nausea before eating solid foods. General instructions  Have a responsible adult stay with you for the time you are told. It is important to have someone help care for you until you are awake and alert.  Take over-the-counter and prescription medicines only as told by your health care provider.  If you have sleep apnea, surgery and certain medicines can increase your risk for breathing problems. Follow instructions from your health care provider about wearing your sleep device: ? Anytime you are sleeping, including during daytime naps. ? While taking prescription pain medicines, sleeping medicines, or medicines that  make you drowsy.  Avoid smoking.  Keep all follow-up visits as told by your health care provider. This is important. Contact a health care provider if:  You keep feeling nauseous or you keep vomiting.  You feel light-headed.  You are still sleepy or having trouble with balance after 24 hours.  You develop a rash.  You have a fever.  You have redness or swelling around the IV site. Get help right away if:  You have trouble breathing.  You have new-onset confusion at home. Summary  For several hours after your procedure, you may feel tired. You may also be forgetful and have poor judgment.  Have a responsible adult stay with you for the time you are told. It is important to have someone help care for you until you are awake and alert.  Rest as told. Do not drive or operate machinery. Do not drink alcohol or take sleeping pills.  Get help right away if you have trouble breathing, or if you suddenly become confused. This information is not intended to replace advice given to you by your health care provider. Make sure you discuss any questions you have with your health care provider. Document Revised:  05/19/2020 Document Reviewed: 08/06/2019 Elsevier Patient Education  2021 [employer].

## 2021-01-02 ENCOUNTER — Other Ambulatory Visit (HOSPITAL_COMMUNITY)
Admission: RE | Admit: 2021-01-02 | Discharge: 2021-01-02 | Disposition: A | Discharge status: Home / Self Care | Payer: BC Managed Care – PPO | Source: Ambulatory Visit | Attending: Internal Medicine | Admitting: Internal Medicine

## 2021-01-02 ENCOUNTER — Encounter (HOSPITAL_COMMUNITY)
Admission: RE | Admit: 2021-01-02 | Discharge: 2021-01-02 | Disposition: A | Discharge status: Home / Self Care | Payer: BC Managed Care – PPO | Source: Ambulatory Visit | Attending: Internal Medicine | Admitting: Internal Medicine

## 2021-01-02 ENCOUNTER — Other Ambulatory Visit: Payer: Self-pay

## 2021-01-02 DIAGNOSIS — Z880 Allergy status to penicillin: Secondary | ICD-10-CM | POA: Diagnosis not present

## 2021-01-02 DIAGNOSIS — K644 Residual hemorrhoidal skin tags: Secondary | ICD-10-CM | POA: Diagnosis not present

## 2021-01-02 DIAGNOSIS — Z800 Family history of malignant neoplasm of digestive organs: Secondary | ICD-10-CM | POA: Diagnosis not present

## 2021-01-02 DIAGNOSIS — Z01812 Encounter for preprocedural laboratory examination: Secondary | ICD-10-CM | POA: Insufficient documentation

## 2021-01-02 DIAGNOSIS — R194 Change in bowel habit: Secondary | ICD-10-CM | POA: Diagnosis not present

## 2021-01-02 DIAGNOSIS — Z881 Allergy status to other antibiotic agents status: Secondary | ICD-10-CM | POA: Diagnosis not present

## 2021-01-02 DIAGNOSIS — Z20822 Contact with and (suspected) exposure to covid-19: Secondary | ICD-10-CM | POA: Diagnosis not present

## 2021-01-02 DIAGNOSIS — F17210 Nicotine dependence, cigarettes, uncomplicated: Secondary | ICD-10-CM | POA: Diagnosis not present

## 2021-01-02 DIAGNOSIS — D125 Benign neoplasm of sigmoid colon: Secondary | ICD-10-CM | POA: Diagnosis not present

## 2021-01-02 DIAGNOSIS — Z885 Allergy status to narcotic agent status: Secondary | ICD-10-CM | POA: Diagnosis not present

## 2021-01-02 DIAGNOSIS — D123 Benign neoplasm of transverse colon: Secondary | ICD-10-CM | POA: Diagnosis not present

## 2021-01-02 DIAGNOSIS — K621 Rectal polyp: Secondary | ICD-10-CM | POA: Diagnosis not present

## 2021-01-02 DIAGNOSIS — D122 Benign neoplasm of ascending colon: Secondary | ICD-10-CM | POA: Diagnosis not present

## 2021-01-02 DIAGNOSIS — Z1211 Encounter for screening for malignant neoplasm of colon: Secondary | ICD-10-CM | POA: Diagnosis not present

## 2021-01-02 DIAGNOSIS — Z79899 Other long term (current) drug therapy: Secondary | ICD-10-CM | POA: Diagnosis not present

## 2021-01-03 LAB — SARS CORONAVIRUS 2 (TAT 6-24 HRS): SARS Coronavirus 2: NEGATIVE

## 2021-01-04 ENCOUNTER — Ambulatory Visit (HOSPITAL_COMMUNITY): Payer: BC Managed Care – PPO | Admitting: Anesthesiology

## 2021-01-04 ENCOUNTER — Encounter (HOSPITAL_COMMUNITY)
Admission: RE | Disposition: A | Discharge status: Home / Self Care | Payer: Self-pay | Source: Home / Self Care | Attending: Internal Medicine

## 2021-01-04 ENCOUNTER — Encounter (HOSPITAL_COMMUNITY): Payer: Self-pay | Admitting: Internal Medicine

## 2021-01-04 ENCOUNTER — Ambulatory Visit (HOSPITAL_COMMUNITY)
Admission: RE | Admit: 2021-01-04 | Discharge: 2021-01-04 | Disposition: A | Discharge status: Home / Self Care | Payer: BC Managed Care – PPO | Attending: Internal Medicine | Admitting: Internal Medicine

## 2021-01-04 DIAGNOSIS — K644 Residual hemorrhoidal skin tags: Secondary | ICD-10-CM | POA: Insufficient documentation

## 2021-01-04 DIAGNOSIS — Z885 Allergy status to narcotic agent status: Secondary | ICD-10-CM | POA: Insufficient documentation

## 2021-01-04 DIAGNOSIS — Z79899 Other long term (current) drug therapy: Secondary | ICD-10-CM | POA: Insufficient documentation

## 2021-01-04 DIAGNOSIS — Z20822 Contact with and (suspected) exposure to covid-19: Secondary | ICD-10-CM | POA: Insufficient documentation

## 2021-01-04 DIAGNOSIS — R194 Change in bowel habit: Secondary | ICD-10-CM | POA: Insufficient documentation

## 2021-01-04 DIAGNOSIS — K621 Rectal polyp: Secondary | ICD-10-CM

## 2021-01-04 DIAGNOSIS — Z800 Family history of malignant neoplasm of digestive organs: Secondary | ICD-10-CM | POA: Insufficient documentation

## 2021-01-04 DIAGNOSIS — Z1211 Encounter for screening for malignant neoplasm of colon: Secondary | ICD-10-CM | POA: Insufficient documentation

## 2021-01-04 DIAGNOSIS — D125 Benign neoplasm of sigmoid colon: Secondary | ICD-10-CM | POA: Insufficient documentation

## 2021-01-04 DIAGNOSIS — D123 Benign neoplasm of transverse colon: Secondary | ICD-10-CM | POA: Diagnosis not present

## 2021-01-04 DIAGNOSIS — Z881 Allergy status to other antibiotic agents status: Secondary | ICD-10-CM | POA: Insufficient documentation

## 2021-01-04 DIAGNOSIS — Z86010 Personal history of colonic polyps: Secondary | ICD-10-CM | POA: Diagnosis not present

## 2021-01-04 DIAGNOSIS — K6289 Other specified diseases of anus and rectum: Secondary | ICD-10-CM

## 2021-01-04 DIAGNOSIS — F17210 Nicotine dependence, cigarettes, uncomplicated: Secondary | ICD-10-CM | POA: Insufficient documentation

## 2021-01-04 DIAGNOSIS — D122 Benign neoplasm of ascending colon: Secondary | ICD-10-CM | POA: Insufficient documentation

## 2021-01-04 DIAGNOSIS — Z880 Allergy status to penicillin: Secondary | ICD-10-CM | POA: Insufficient documentation

## 2021-01-04 HISTORY — PX: POLYPECTOMY: SHX5525

## 2021-01-04 HISTORY — PX: COLONOSCOPY WITH PROPOFOL: SHX5780

## 2021-01-04 HISTORY — PX: BIOPSY: SHX5522

## 2021-01-04 LAB — HM COLONOSCOPY

## 2021-01-04 SURGERY — COLONOSCOPY WITH PROPOFOL
Anesthesia: General

## 2021-01-04 MED ORDER — PROPOFOL 500 MG/50ML IV EMUL
INTRAVENOUS | Status: DC | PRN
  Administered 2021-01-04: 125 ug/kg/min via INTRAVENOUS

## 2021-01-04 MED ORDER — STERILE WATER FOR IRRIGATION IR SOLN
Status: DC | PRN
  Administered 2021-01-04: 100 mL

## 2021-01-04 MED ORDER — LIDOCAINE HCL (CARDIAC) PF 100 MG/5ML IV SOSY
PREFILLED_SYRINGE | INTRAVENOUS | Status: DC | PRN
  Administered 2021-01-04: 50 mg via INTRAVENOUS

## 2021-01-04 MED ORDER — PROPOFOL 10 MG/ML IV BOLUS
INTRAVENOUS | Status: DC | PRN
  Administered 2021-01-04: 100 mg via INTRAVENOUS
  Administered 2021-01-04: 40 mg via INTRAVENOUS
  Administered 2021-01-04: 30 mg via INTRAVENOUS

## 2021-01-04 MED ORDER — EPHEDRINE SULFATE 50 MG/ML IJ SOLN
INTRAMUSCULAR | Status: DC | PRN
  Administered 2021-01-04 (×5): 10 mg via INTRAVENOUS

## 2021-01-04 MED ORDER — LACTATED RINGERS IV SOLN: INTRAVENOUS | Status: DC | PRN

## 2021-01-04 NOTE — Discharge Instructions (Signed)
No aspirin or NSAIDs for 24 hours. Resume usual medications and diet as before. No driving for 24 hours. Physician will call with biopsy results.   Colonoscopy, Adult, Care After This sheet gives you information about how to care for yourself after your procedure. Your doctor may also give you more specific instructions. If you have problems or questions, call your doctor. What can I expect after the procedure? After the procedure, it is common to have:  A small amount of blood in your poop (stool) for 24 hours.  Some gas.  Mild cramping or bloating in your belly (abdomen). Follow these instructions at home: Eating and drinking  Drink enough fluid to keep your pee (urine) pale yellow.  Follow instructions from your doctor about what you cannot eat or drink.  Return to your normal diet as told by your doctor. Avoid heavy or fried foods that are hard to digest.   Activity  Rest as told by your doctor.  Do not sit for a long time without moving. Get up to take short walks every 1-2 hours. This is important. Ask for help if you feel weak or unsteady.  Return to your normal activities as told by your doctor. Ask your doctor what activities are safe for you. To help cramping and bloating:  Try walking around.  Put heat on your belly as told by your doctor. Use the heat source that your doctor recommends, such as a moist heat pack or a heating pad. ? Put a towel between your skin and the heat source. ? Leave the heat on for 20-30 minutes. ? Remove the heat if your skin turns bright red. This is very important if you are unable to feel pain, heat, or cold. You may have a greater risk of getting burned.   General instructions  If you were given a medicine to help you relax (sedative) during your procedure, it can affect you for many hours. Do not drive or use machinery until your doctor says that it is safe.  For the first 24 hours after the procedure: ? Do not sign important  documents. ? Do not drink alcohol. ? Do your daily activities more slowly than normal. ? Eat foods that are soft and easy to digest.  Take over-the-counter or prescription medicines only as told by your doctor.  Keep all follow-up visits as told by your doctor. This is important. Contact a doctor if:  You have blood in your poop 2-3 days after the procedure. Get help right away if:  You have more than a small amount of blood in your poop.  You see large clumps of tissue (blood clots) in your poop.  Your belly is swollen.  You feel like you may vomit (nauseous).  You vomit.  You have a fever.  You have belly pain that gets worse, and medicine does not help your pain. Summary  After the procedure, it is common to have a small amount of blood in your poop. You may also have mild cramping and bloating in your belly.  If you were given a medicine to help you relax (sedative) during your procedure, it can affect you for many hours. Do not drive or use machinery until your doctor says that it is safe.  Get help right away if you have a lot of blood in your poop, feel like you may vomit, have a fever, or have more belly pain. This information is not intended to replace advice given to you by your   health care provider. Make sure you discuss any questions you have with your health care provider. Document Revised: 07/10/2019 Document Reviewed: 03/30/2019 Elsevier Patient Education  2021 Elsevier Inc.  

## 2021-01-04 NOTE — H&P (Signed)
Crystal Douglas is an 65 y.o. female.   Chief Complaint: Patient here for colonoscopy. HPI: Patient is 65 year old Caucasian female who has history of colonic adenomas and family history of colon carcinoma in a first-degree relative who is here for surveillance colonoscopy.  Her last exam was in June 2015 with removal of multiple small polyps but these were non-adenomatous.  She did have tubular adenoma removed from rectosigmoid area.  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  She denies abdominal pain or rectal bleeding.  He used to be constipated but now she is having loose stool.  She generally has 1 stool daily.  No history of weight loss. Family history significant for colon carcinoma in her father who was in his 14s at the time of diagnosis.  Past Medical History:  Diagnosis Date  . Anxiety       History of colonic polyps.  Past Surgical History:  Procedure Laterality Date  . CATARACT EXTRACTION W/PHACO Right 02/21/2016   Procedure: CATARACT EXTRACTION PHACO AND INTRAOCULAR LENS PLACEMENT (IOC);  Surgeon: Rutherford Guys, MD;  Location: [facility];  Service: Ophthalmology;  Laterality: Right;  CDE:3.66  . CATARACT EXTRACTION W/PHACO Left 03/13/2016   Procedure: CATARACT EXTRACTION PHACO AND INTRAOCULAR LENS PLACEMENT (IOC);  Surgeon: Rutherford Guys, MD;  Location: [facility];  Service: Ophthalmology;  Laterality: Left;  CDE: 4.73                    . COLONOSCOPY N/A 03/11/2014   Procedure: COLONOSCOPY;  Surgeon: Rogene Houston, MD;  Location: [facility];  Service: Endoscopy;  Laterality: N/A;  1030  . TONSILLECTOMY     @65  years of age    Family History  Problem Relation Age of Onset  . Colon cancer Father   . Cancer Father        prostate   . Colon cancer Other    Social History:  reports that she has been smoking. She has been smoking about 1.50 packs per day. She has never used smokeless tobacco. She reports that she does not drink alcohol and does not use  drugs.  Allergies:  Allergies  Allergen Reactions  . Codeine Other (See Comments)    Patient states "things were crawling on me".  . Lactose Intolerance (Gi) Hives  . Penicillins Hives      . Shellfish Allergy Nausea And Vomiting  . Tetracyclines & Related Hives    Medications Prior to Admission  Medication Sig Dispense Refill  . ALPRAZolam (XANAX) 1 MG tablet Take 2 mg by mouth at bedtime.    Marland Kitchen escitalopram (LEXAPRO) 5 MG tablet Take 2.5 mg by mouth daily.      Results for orders placed or performed during the hospital encounter of 01/02/21 (from the past 48 hour(s))  SARS CORONAVIRUS 2 (TAT 6-24 HRS) Nasopharyngeal Nasopharyngeal Swab     Status: None   Collection Time: 01/02/21  1:05 PM   Specimen: Nasopharyngeal Swab  Result Value Ref Range   SARS Coronavirus 2 NEGATIVE NEGATIVE    Comment: (NOTE) SARS-CoV-2 target nucleic acids are NOT DETECTED.  The SARS-CoV-2 RNA is generally detectable in upper and lower respiratory specimens during the acute phase of infection. Negative results do not preclude SARS-CoV-2 infection, do not rule out co-infections with other pathogens, and should not be used as the sole basis for treatment or other patient management decisions. Negative results must be combined with clinical observations, patient history, and epidemiological information. The expected result is Negative.  Fact Sheet for Patients: SugarRoll.be  Fact Sheet for Healthcare Providers: https://www.woods-mathews.com/  This test is not yet approved or cleared by the [place] FDA and  has been authorized for detection and/or diagnosis of SARS-CoV-2 by FDA under an Emergency Use Authorization (EUA). This EUA will remain  in effect (meaning this test can be used) for the duration of the COVID-19 declaration under Se ction 564(b)(1) of the Act, 21 U.S.C. section 360bbb-3(b)(1), unless the authorization is terminated or revoked  sooner.  Performed at [facility], [place] [address], [place], [place] [address]    No results found.  Review of Systems  Pulse (!) 54, temperature 97.8 F (36.6 C), temperature source Oral, resp. rate 16, height 5\' 5"  (1.651 m), weight 61.7 kg, SpO2 100 %. Physical Exam HENT:     Mouth/Throat:     Mouth: Mucous membranes are moist.     Pharynx: Oropharynx is clear.  Eyes:     General: No scleral icterus.    Conjunctiva/sclera: Conjunctivae normal.  Cardiovascular:     Rate and Rhythm: Normal rate and regular rhythm.     Heart sounds: Normal heart sounds. No murmur heard.   Pulmonary:     Effort: Pulmonary effort is normal.     Breath sounds: Normal breath sounds.  Abdominal:     General: There is no distension.     Palpations: Abdomen is soft. There is no mass.     Tenderness: There is no abdominal tenderness.  Musculoskeletal:        General: No swelling.     Cervical back: Neck supple.  Lymphadenopathy:     Cervical: No cervical adenopathy.  Skin:    General: Skin is warm and dry.  Neurological:     Mental Status: She is alert.      Assessment/Plan History of colonic adenomas. Surveillance colonoscopy.  Hildred Laser, MD 01/04/2021, 8:45 AM

## 2021-01-04 NOTE — Op Note (Signed)
[facility] Patient Name: Crystal Douglas Procedure Date: 01/04/2021 8:29 AM MRN: [id number] Date of Birth: [birth year] Attending MD: Hildred Laser , MD CSN: [id number] Age: 65 Admit Type: Outpatient Procedure:                Colonoscopy Indications:              High risk colon cancer surveillance: Personal                            history of colonic polyps; patient also reports                            change in bowel habits. She is gone from being                            constipated to having loose stools. Providers:                Hildred Laser, MD, Lambert Mody, Aram Candela Referring MD:             Asencion Noble, MD Medicines:                Propofol per Anesthesia Complications:            No immediate complications. Estimated Blood Loss:     Estimated blood loss was minimal. Procedure:                Pre-Anesthesia Assessment:                           - Prior to the procedure, a History and Physical                            was performed, and patient medications and                            allergies were reviewed. The patient's tolerance of                            previous anesthesia was also reviewed. The risks                            and benefits of the procedure and the sedation                            options and risks were discussed with the patient.                            All questions were answered, and informed consent                            was obtained. Prior Anticoagulants: The patient has                            taken no previous anticoagulant or antiplatelet  agents. ASA Grade Assessment: II - A patient with                            mild systemic disease. After reviewing the risks                            and benefits, the patient was deemed in                            satisfactory condition to undergo the procedure.                           After obtaining informed consent, the colonoscope                             was passed under direct vision. Throughout the                            procedure, the patient's blood pressure, pulse, and                            oxygen saturations were monitored continuously. The                            [id number]-HQ190L ([id number]) scope was introduced through                            the anus and advanced to the the cecum, identified                            by appendiceal orifice and ileocecal valve. The                            colonoscopy was performed without difficulty. The                            patient tolerated the procedure well. The quality                            of the bowel preparation was good. The ileocecal                            valve, appendiceal orifice, and rectum were                            photographed. Scope In: 8:56:07 AM Scope Out: 9:34:02 AM Scope Withdrawal Time: 0 hours 29 minutes 11 seconds  Total Procedure Duration: 0 hours 37 minutes 55 seconds  Findings:      The perianal and digital rectal examinations were normal.      A 6 mm polyp was found in the mid transverse colon. The polyp was flat.       The polyp was removed with a cold snare. Resection and retrieval were       complete. The pathology specimen  was placed into Bottle Number 1.      Five flat polyps were found in the distal sigmoid colon, splenic flexure       and hepatic flexure. The polyps were small in size. These polyps were       removed with a cold snare. Resection was complete, but the polyp tissue       was only partially retrieved. The pathology specimen was placed into       Bottle Number 2.      Two polyps were found in the rectum. The polyps were small in size.       These were biopsied with a cold forceps for histology. The pathology       specimen was placed into Bottle Number 4.      Normal mucosa was found in the sigmoid colon. Biopsies for histology       were taken with a cold forceps from the sigmoid colon for  evaluation of       microscopic colitis.      External hemorrhoids were found during retroflexion. The hemorrhoids       were small.      Anal papilla(e) were hypertrophied. Impression:               - One 6 mm polyp in the mid transverse colon,                            removed with a cold snare. Resected and retrieved.                           - Five small polyps in the distal sigmoid colon, at                            the splenic flexure and at the hepatic flexure,                            removed with a cold snare. Complete resection.                            Partial retrieval. One polyp was lost.                           - Two small polyps in the rectum. Biopsied.                           - Normal mucosa in the sigmoid colon. Biopsied.                           - External hemorrhoids.                           - Anal papilla(e) were hypertrophied. Moderate Sedation:      Per Anesthesia Care Recommendation:           - Patient has a contact number available for                            emergencies. The signs and symptoms of potential  delayed complications were discussed with the                            patient. Return to normal activities tomorrow.                            Written discharge instructions were provided to the                            patient.                           - Resume previous diet today.                           - Continue present medications.                           - No aspirin, ibuprofen, naproxen, or other                            non-steroidal anti-inflammatory drugs for 1 day.                           - Await pathology results.                           - Repeat colonoscopy in 5 years for surveillance. Procedure Code(s):        --- Professional ---                           [phone], Colonoscopy, flexible; with removal of                            tumor(s), polyp(s), or other lesion(s) by snare                             technique                           45380, 59, Colonoscopy, flexible; with biopsy,                            single or multiple Diagnosis Code(s):        --- Professional ---                           Z86.010, Personal history of colonic polyps                           K63.5, Polyp of colon                           K62.1, Rectal polyp                           K64.4, Residual hemorrhoidal skin tags  K62.89, Other specified diseases of anus and rectum CPT copyright 2019 American Medical Association. All rights reserved. The codes documented in this report are preliminary and upon coder review may  be revised to meet current compliance requirements. Hildred Laser, MD Hildred Laser, MD 01/04/2021 9:46:41 AM This report has been signed electronically. Number of Addenda: 0

## 2021-01-04 NOTE — Transfer of Care (Signed)
Immediate Anesthesia Transfer of Care Note  Patient: Crystal Douglas  Procedure(s) Performed: COLONOSCOPY WITH PROPOFOL (N/A ) POLYPECTOMY BIOPSY  Patient Location: PACU  Anesthesia Type:General  Level of Consciousness: awake, alert  and oriented  Airway & Oxygen Therapy: Patient Spontanous Breathing  Post-op Assessment: Report given to RN and Post -op Vital signs reviewed and stable  Post vital signs: Reviewed and stable  Last Vitals:  Vitals Value Taken Time  BP    Temp 36.5 C 01/04/21 0941  Pulse 77 01/04/21 0941  Resp 20 01/04/21 0941  SpO2 100 % 01/04/21 0941    Last Pain:  Vitals:   01/04/21 0941  TempSrc: Axillary  PainSc: 0-No pain         Complications: No complications documented.

## 2021-01-04 NOTE — Anesthesia Preprocedure Evaluation (Signed)
Anesthesia Evaluation  Patient identified by MRN, date of birth, ID band Patient awake    Reviewed: Allergy & Precautions, H&P , NPO status , Patient's Chart, lab work & pertinent test results, reviewed documented beta blocker date and time   Airway Mallampati: II  TM Distance: >3 FB Neck ROM: full    Dental no notable dental hx.    Pulmonary neg pulmonary ROS, Current Smoker,    Pulmonary exam normal breath sounds clear to auscultation       Cardiovascular Exercise Tolerance: Good negative cardio ROS   Rhythm:regular Rate:Normal     Neuro/Psych PSYCHIATRIC DISORDERS Anxiety negative neurological ROS     GI/Hepatic negative GI ROS, Neg liver ROS,   Endo/Other  negative endocrine ROS  Renal/GU negative Renal ROS  negative genitourinary   Musculoskeletal   Abdominal   Peds  Hematology negative hematology ROS (+)   Anesthesia Other Findings   Reproductive/Obstetrics negative OB ROS                             Anesthesia Physical Anesthesia Plan  ASA: II  Anesthesia Plan: General   Post-op Pain Management:    Induction:   PONV Risk Score and Plan: Propofol infusion  Airway Management Planned:   Additional Equipment:   Intra-op Plan:   Post-operative Plan:   Informed Consent: I have reviewed the patients History and Physical, chart, labs and discussed the procedure including the risks, benefits and alternatives for the proposed anesthesia with the patient or authorized representative who has indicated his/her understanding and acceptance.     Dental Advisory Given  Plan Discussed with: CRNA  Anesthesia Plan Comments:         Anesthesia Quick Evaluation  

## 2021-01-04 NOTE — Anesthesia Postprocedure Evaluation (Signed)
Anesthesia Post Note  Patient: Crystal Douglas  Procedure(s) Performed: COLONOSCOPY WITH PROPOFOL (N/A ) POLYPECTOMY BIOPSY  Patient location during evaluation: Phase II Anesthesia Type: General Level of consciousness: awake Pain management: pain level controlled Vital Signs Assessment: post-procedure vital signs reviewed and stable Respiratory status: spontaneous breathing and respiratory function stable Cardiovascular status: blood pressure returned to baseline and stable Postop Assessment: no headache and no apparent nausea or vomiting Anesthetic complications: no Comments: Late entry   No complications documented.   Last Vitals:  Vitals:   01/04/21 0941 01/04/21 0946  BP: (!) 86/74 (!) 90/54  Pulse: 77   Resp: 20   Temp: 36.5 C   SpO2: 100%     Last Pain:  Vitals:   01/04/21 0941  TempSrc: Axillary  PainSc: 0-No pain                 Louann Sjogren

## 2021-01-05 LAB — SURGICAL PATHOLOGY

## 2021-01-06 ENCOUNTER — Encounter (HOSPITAL_COMMUNITY): Payer: Self-pay | Admitting: Internal Medicine

## 2021-01-06 ENCOUNTER — Encounter (INDEPENDENT_AMBULATORY_CARE_PROVIDER_SITE_OTHER): Payer: Self-pay | Admitting: *Deleted

## 2022-08-24 ENCOUNTER — Other Ambulatory Visit (HOSPITAL_COMMUNITY): Payer: Self-pay | Admitting: Internal Medicine

## 2022-08-24 DIAGNOSIS — Z122 Encounter for screening for malignant neoplasm of respiratory organs: Secondary | ICD-10-CM

## 2022-08-24 DIAGNOSIS — F17200 Nicotine dependence, unspecified, uncomplicated: Secondary | ICD-10-CM

## 2022-08-29 IMAGING — CT CT ABD-PEL WO/W CM
3 of 12 series · 10 of 46 positions shown, 16 images · IV contrast (omnipaque)
Comparison: 11/29/2009.

CLINICAL DATA: Recurrent UTIs with microscopic hematuria.

EXAM:
CT ABDOMEN AND PELVIS WITHOUT AND WITH CONTRAST
TECHNIQUE: Multidetector CT imaging of the abdomen and pelvis was performed
following the standard protocol before and following the bolus
administration of intravenous contrast.
CONTRAST:  125mL OMNIPAQUE IOHEXOL 300 MG/ML  SOLN

[Series 3: axial pre · axial · non-contrast · 0.78mm/px · z∈[+977,+1247]mm · 4 of 92 slices shown]
[im 19/92  soft-tissue]
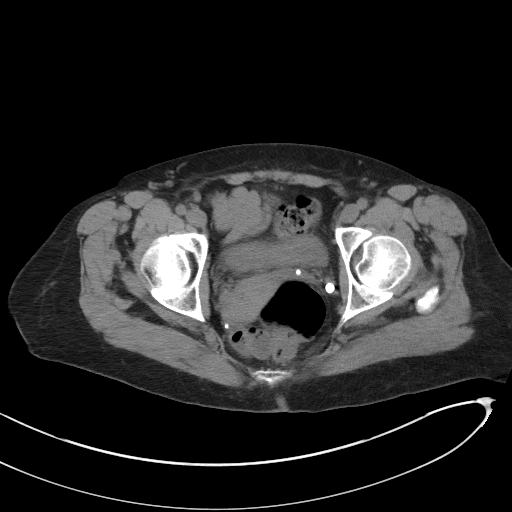
[im 37/92  soft-tissue]
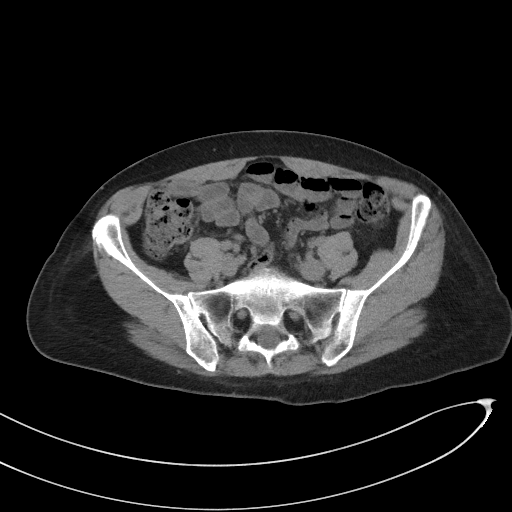
[im 55/92  soft-tissue]
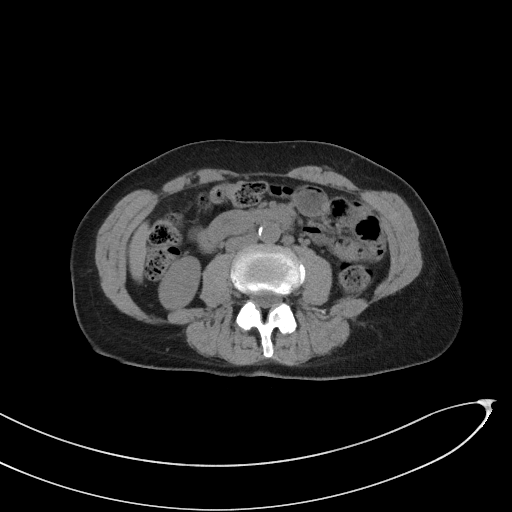
[im 73/92  soft-tissue]
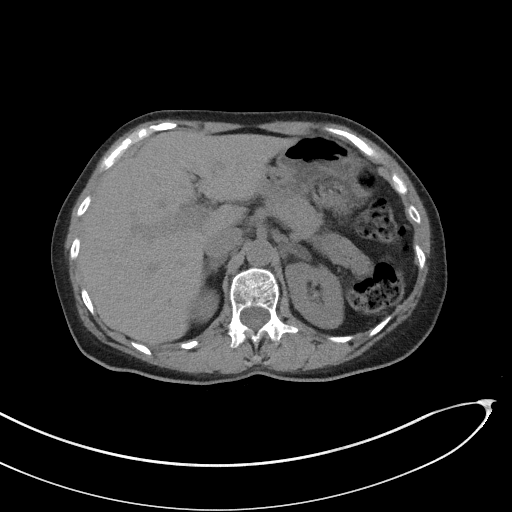

[Series 8: axial post · axial · 0.78mm/px · z∈[+967,+1247]mm · 4 of 94 slices shown, 9 images]
[im 19/94  soft-tissue]
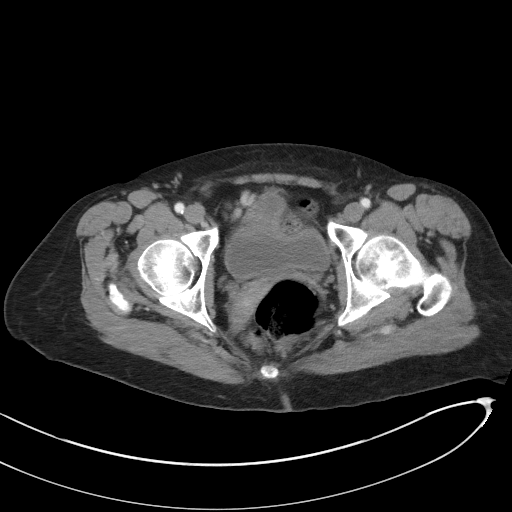
[im 19/94  lung]
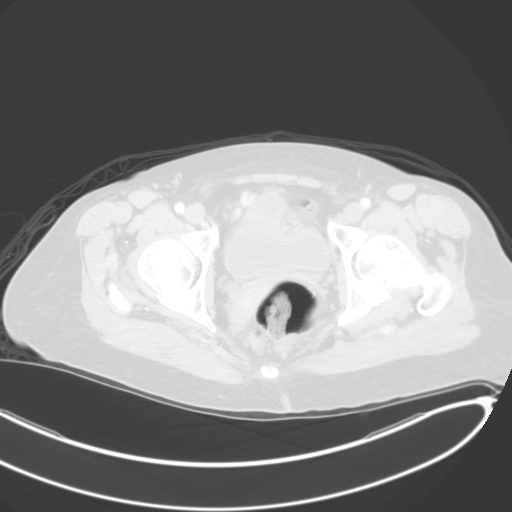
[im 19/94  bone]
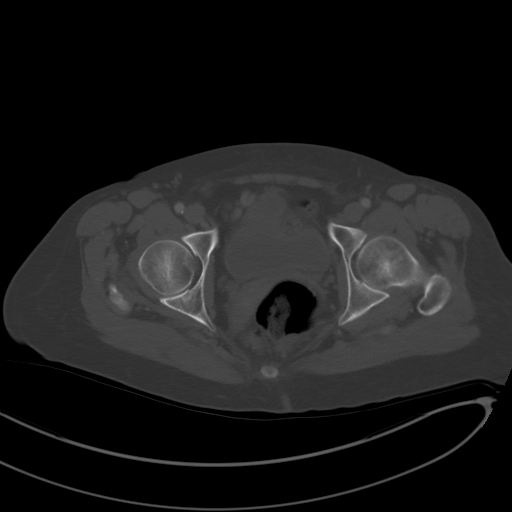
[im 38/94  soft-tissue]
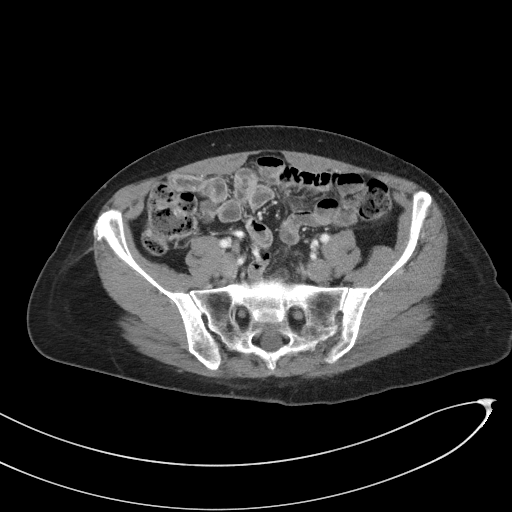
[im 38/94  lung]
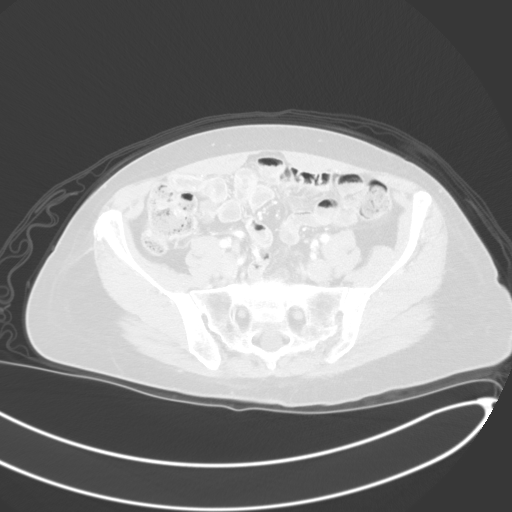
[im 56/94  soft-tissue]
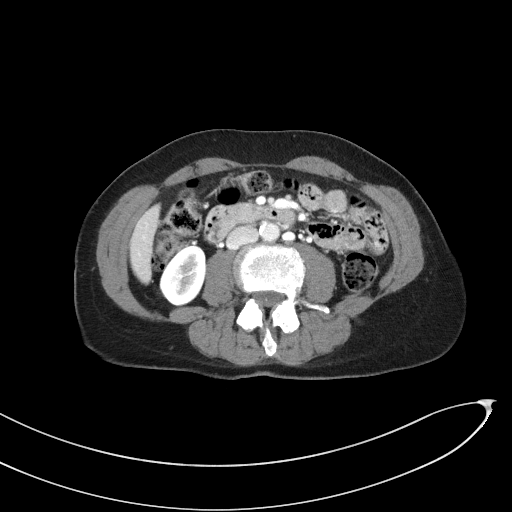
[im 56/94  lung]
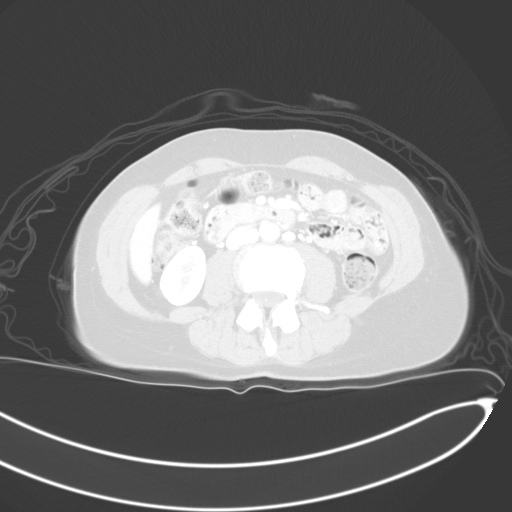
[im 75/94  soft-tissue]
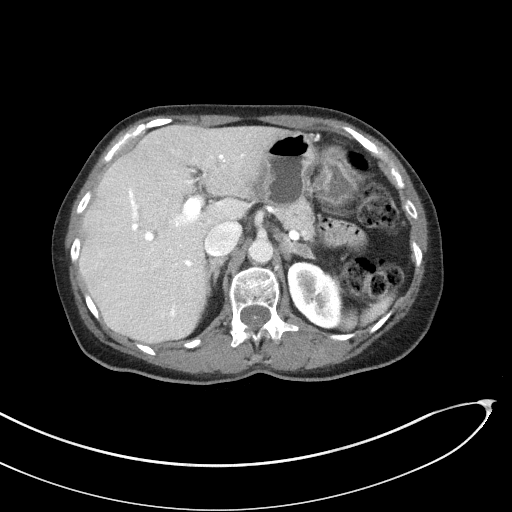
[im 75/94  lung]
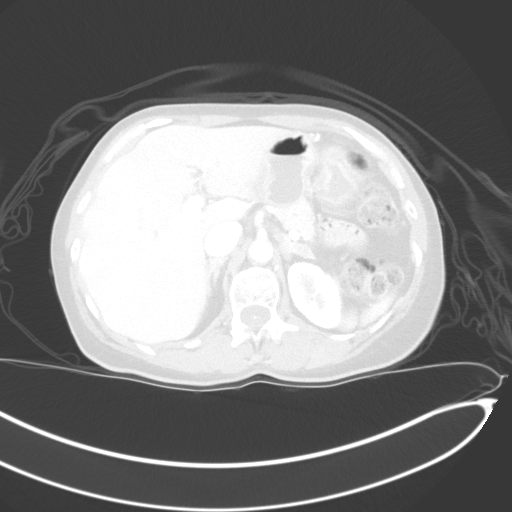

[Series 16: coronal delay · coronal · delayed · 0.71mm/px · 2 of 101 slices shown, 3 images]
[im 34/101  soft-tissue]
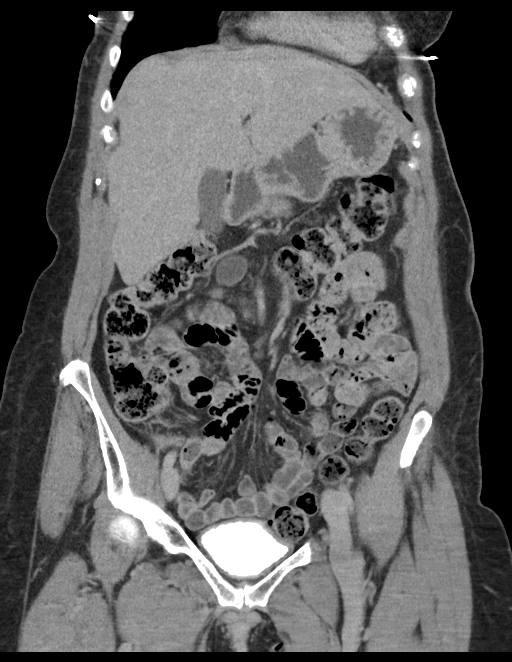
[im 34/101  bone]
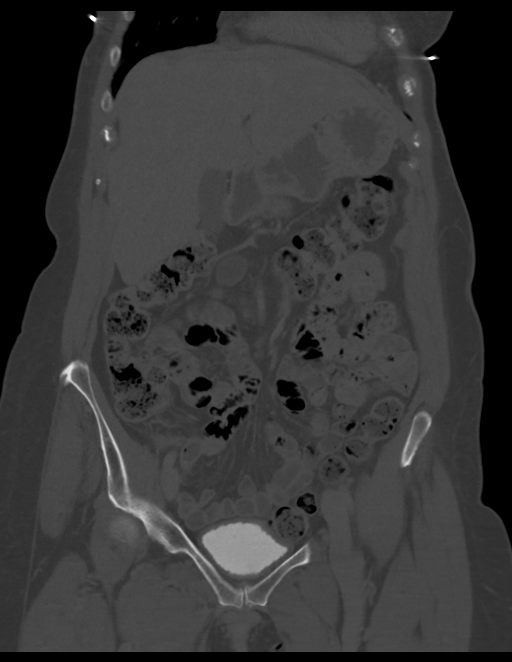
[im 67/101  soft-tissue]
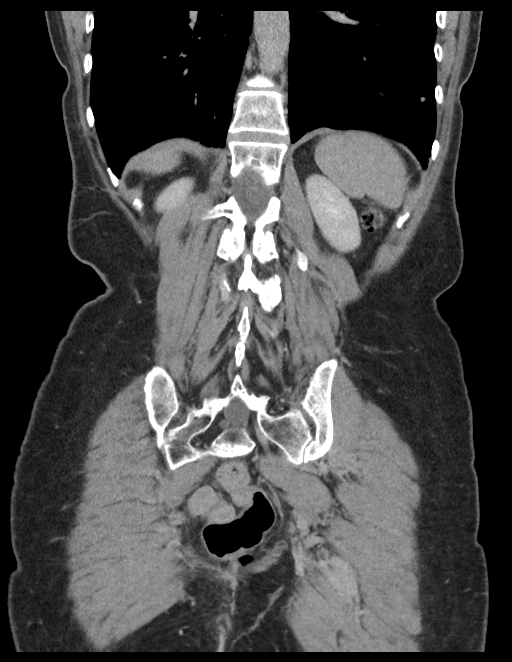

[10 of 46 positions shown; findings below may reference images not displayed]

FINDINGS: Lower chest: 6 mm left lower lobe pulmonary nodule on image 7/series
10 is stable since the [DATE] exam, consistent with benign etiology.

Hepatobiliary: No suspicious focal abnormality within the liver
parenchyma. There is no evidence for gallstones, gallbladder wall
thickening, or pericholecystic fluid. Focal soft tissue thickening
at the gallbladder fundus likely related to adenomyomatosis. No
intrahepatic or extrahepatic biliary dilation.

Pancreas: No focal mass lesion. No dilatation of the main duct. No
intraparenchymal cyst. No peripancreatic edema.

Spleen: No splenomegaly. No focal mass lesion.

Adrenals/Urinary Tract: Right adrenal gland unremarkable. Left
adrenal thickening stable since prior study.

Precontrast imaging shows no stones in either kidney or ureter. No
bladder stones

Imaging after IV contrast administration shows no suspicious
enhancing lesion in either kidney. 9 mm hypodensity lower pole right
kidney noted with a similar 8 mm hypodensity in the lower pole of
the left kidney, both are technically too small to characterize but
most likely benign cyst.

Delayed post-contrast imaging shows no wall thickening or soft
tissue filling defect in either intrarenal collecting system or
renal pelvis. Neither ureter is completely opacified, but there is
no focal dilatation, wall thickening, or soft tissue lesion evident
along either ureter. Delayed imaging of the bladder shows no focal
wall thickening or mass lesion.

Stomach/Bowel: Stomach is unremarkable. No gastric wall thickening.
No evidence of outlet obstruction. Duodenum is normally positioned
as is the ligament of Treitz. Large duodenal diverticulum noted. No
small bowel wall thickening. No small bowel dilatation. The terminal
ileum is normal. The appendix is normal. No gross colonic mass. No
colonic wall thickening.

Vascular/Lymphatic: There is abdominal aortic atherosclerosis
without aneurysm. There is no gastrohepatic or hepatoduodenal
ligament lymphadenopathy. No retroperitoneal or mesenteric
lymphadenopathy. No pelvic sidewall lymphadenopathy.

Reproductive: The uterus is unremarkable.  There is no adnexal mass.

Other: No intraperitoneal free fluid.

Musculoskeletal: No worrisome lytic or sclerotic osseous
abnormality.
IMPRESSION: 1. No acute findings in the abdomen or pelvis. Specifically, no
findings to explain the patient's history of hematuria.
2. Tiny hypodensities in each kidney are technically too small to
characterize but most likely benign and are probably cysts.
3. Focal soft tissue thickening at the gallbladder fundus, not
readily evident on prior remote CT. While this is most likely
adenomyomatosis, right upper quadrant ultrasound recommended to
confirm.
4. Aortic Atherosclerosis ([id number]).

## 2022-09-20 ENCOUNTER — Ambulatory Visit (HOSPITAL_COMMUNITY)
Admission: RE | Admit: 2022-09-20 | Discharge: 2022-09-20 | Disposition: A | Discharge status: Home / Self Care | Payer: Medicare PPO | Source: Ambulatory Visit | Attending: Internal Medicine | Admitting: Internal Medicine

## 2022-09-20 DIAGNOSIS — Z122 Encounter for screening for malignant neoplasm of respiratory organs: Secondary | ICD-10-CM | POA: Insufficient documentation

## 2022-09-20 DIAGNOSIS — F17210 Nicotine dependence, cigarettes, uncomplicated: Secondary | ICD-10-CM | POA: Insufficient documentation

## 2022-09-20 DIAGNOSIS — F17200 Nicotine dependence, unspecified, uncomplicated: Secondary | ICD-10-CM | POA: Insufficient documentation

## 2022-09-20 DIAGNOSIS — J439 Emphysema, unspecified: Secondary | ICD-10-CM | POA: Diagnosis not present

## 2022-10-05 IMAGING — MG MM BREAST LOCALIZATION CLIP
4 series · 4 of 12 positions shown · non-contrast
Comparison: Previous exam(s).

CLINICAL DATA: Evaluate post biopsy marker clip placement following
stereotactic core needle biopsy of right breast calcifications.

EXAM:
DIAGNOSTIC RIGHT MAMMOGRAM POST STEREOTACTIC BIOPSY

[R CC synth-2D]
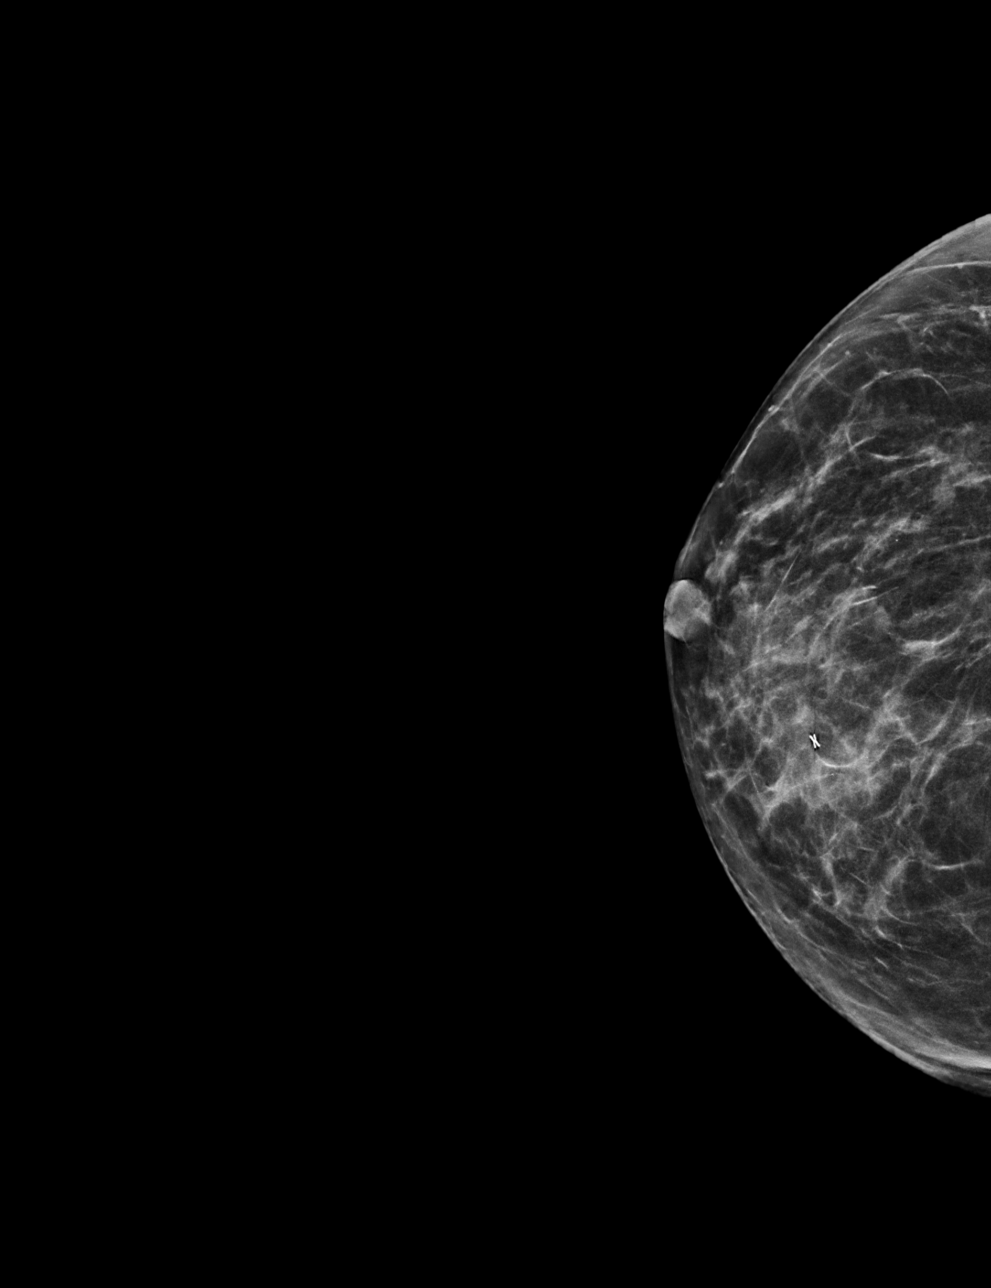

[R ML synth-2D]
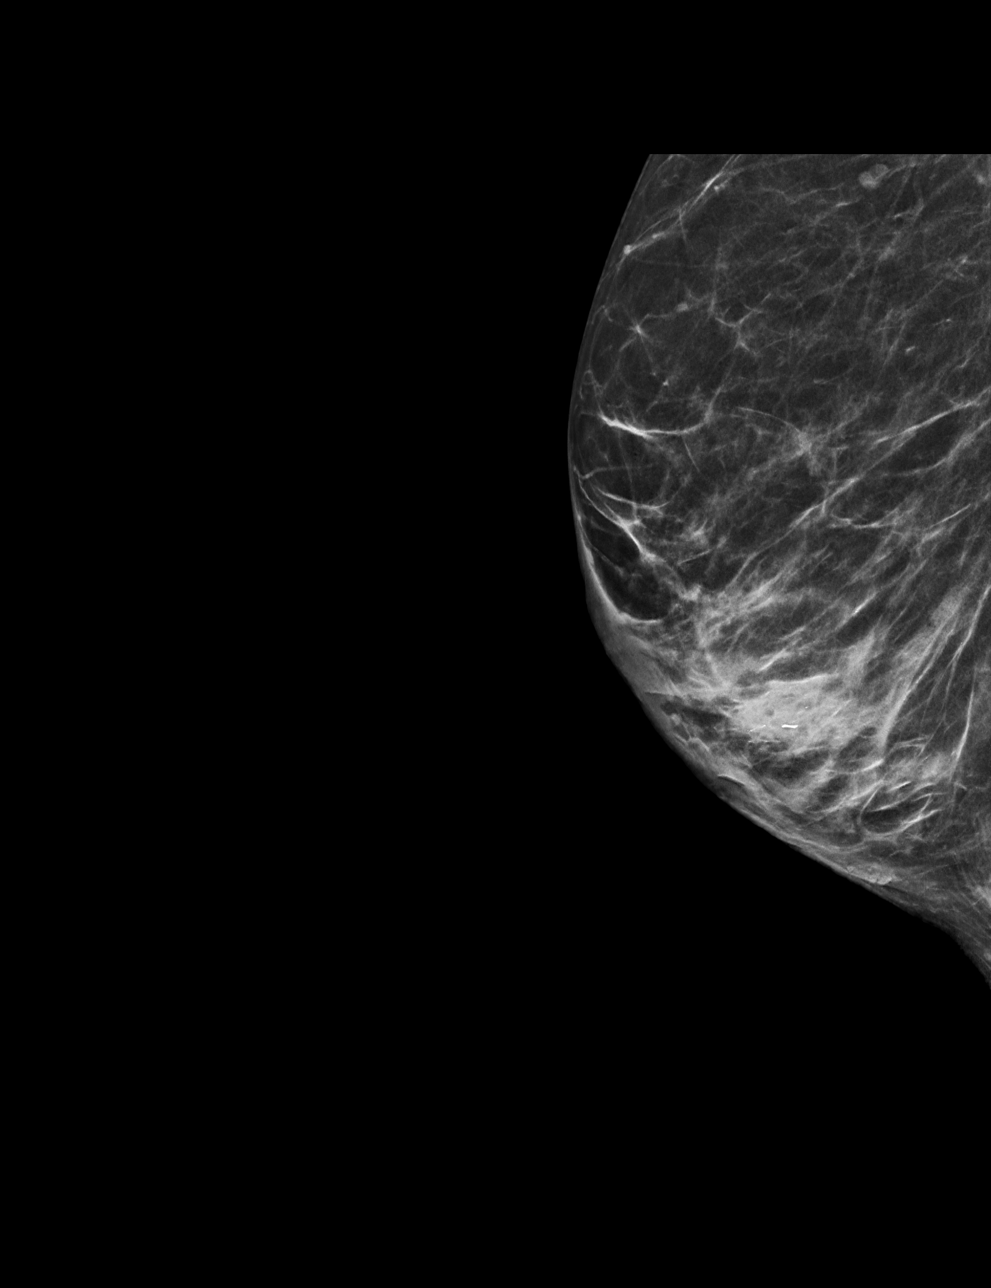

[R CC tomo · tomo slice 28/55.0]
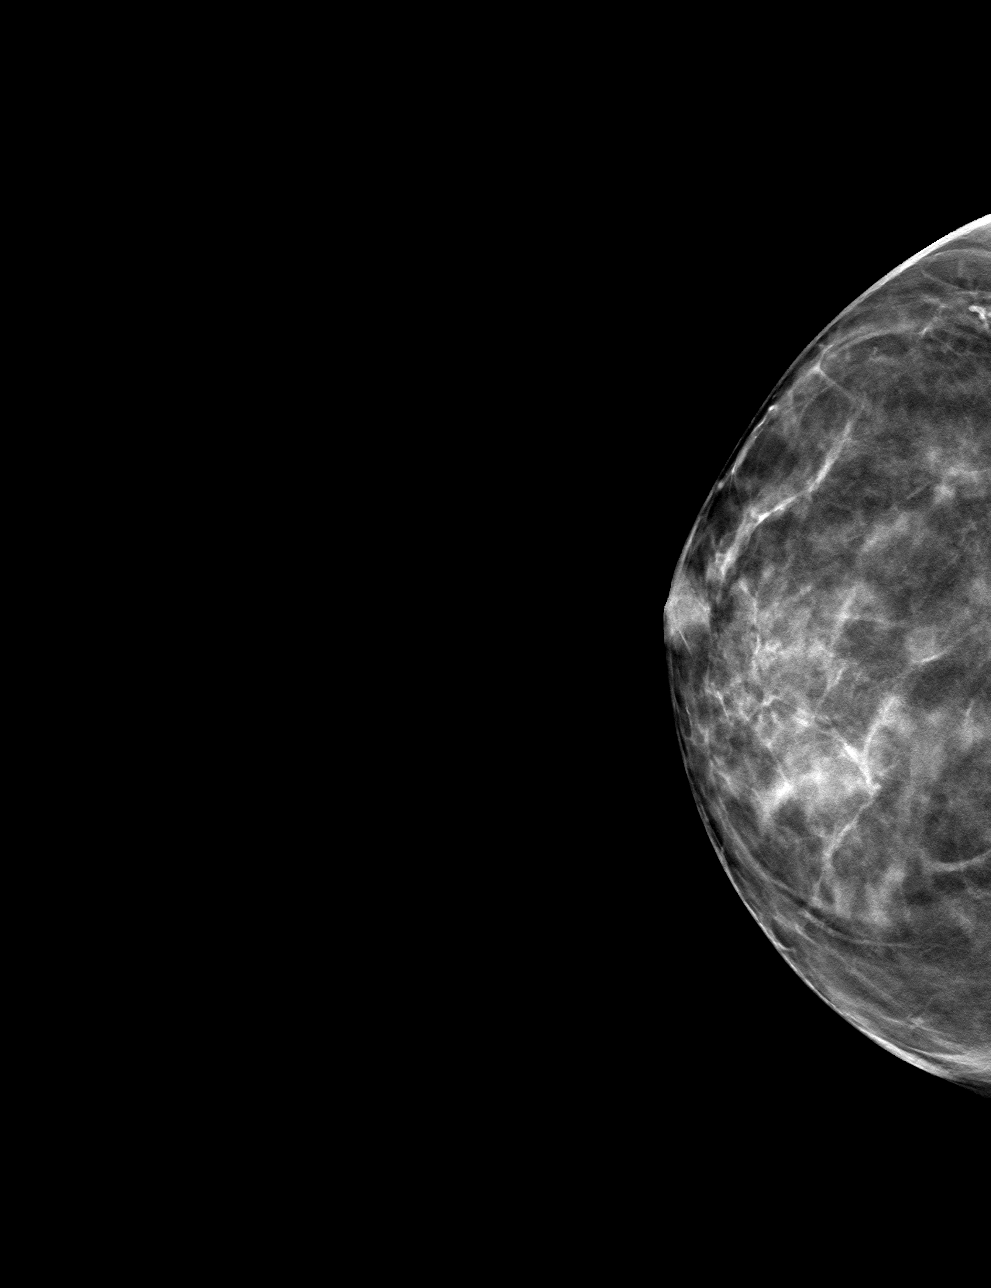

[R ML tomo · tomo slice 29/57.0]
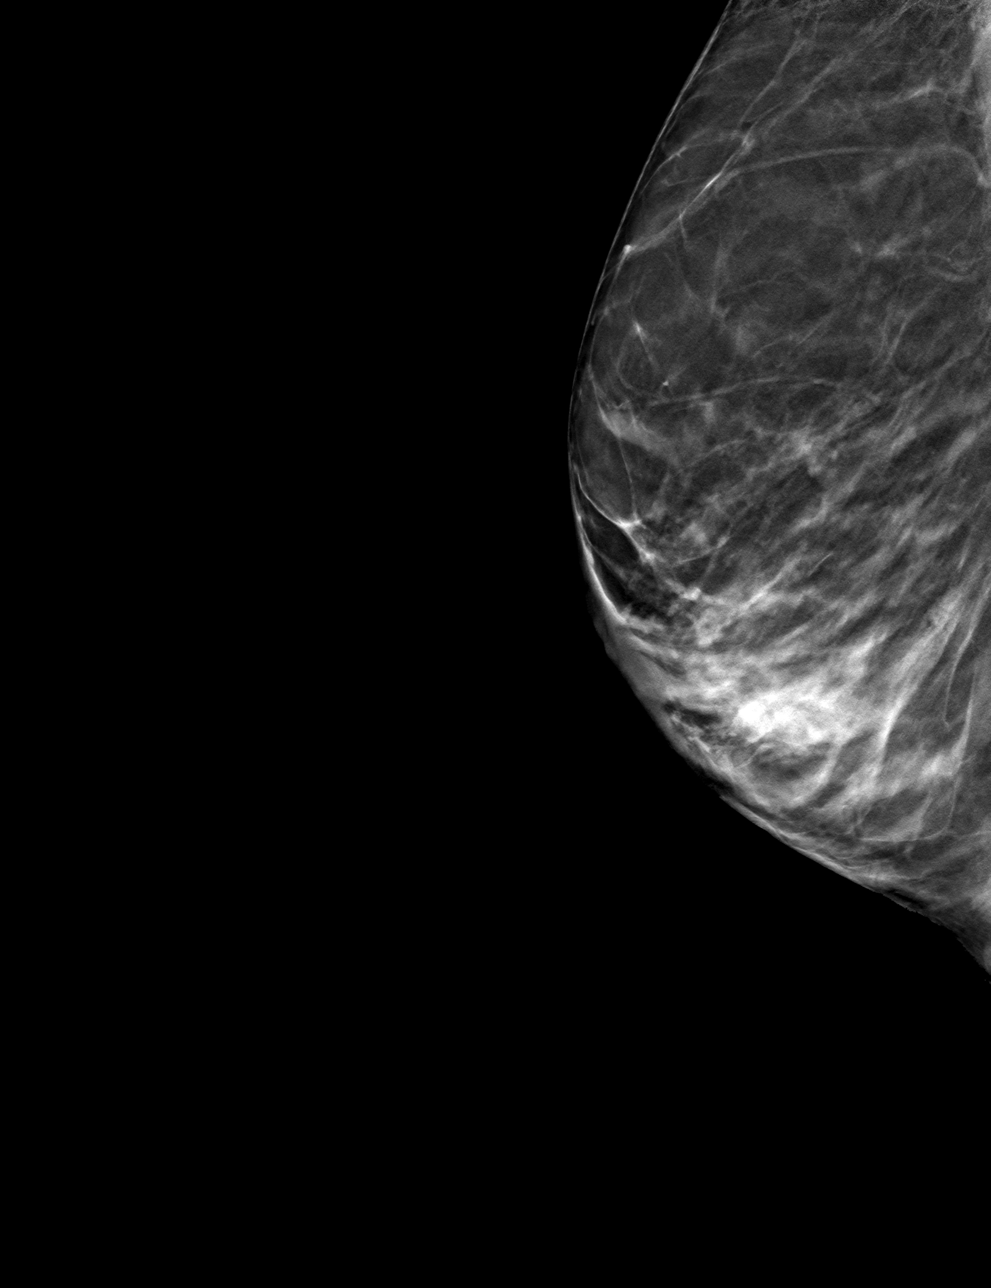

[4 of 12 positions shown; findings below may reference images not displayed]

FINDINGS: Mammographic images were obtained following stereotactic guided
biopsy of right breast calcifications. The biopsy marking clip is in
expected position at the site of biopsy.
IMPRESSION: Appropriate positioning of the X shaped biopsy marking clip at the
site of biopsy in the inferior slightly medial aspect of the right
breast.

Final Assessment: Post Procedure Mammograms for Marker Placement

## 2022-10-08 ENCOUNTER — Other Ambulatory Visit: Payer: Self-pay | Admitting: General Surgery

## 2022-10-08 DIAGNOSIS — Z9889 Other specified postprocedural states: Secondary | ICD-10-CM

## 2022-10-15 ENCOUNTER — Ambulatory Visit
Admission: RE | Admit: 2022-10-15 | Discharge: 2022-10-15 | Disposition: A | Discharge status: Home / Self Care | Payer: Medicare PPO | Source: Ambulatory Visit | Attending: General Surgery | Admitting: General Surgery

## 2022-10-15 DIAGNOSIS — Z9889 Other specified postprocedural states: Secondary | ICD-10-CM

## 2022-12-26 ENCOUNTER — Other Ambulatory Visit (HOSPITAL_COMMUNITY): Payer: Self-pay | Admitting: Internal Medicine

## 2022-12-26 DIAGNOSIS — R911 Solitary pulmonary nodule: Secondary | ICD-10-CM

## 2023-02-26 ENCOUNTER — Encounter (HOSPITAL_COMMUNITY): Payer: Self-pay

## 2023-02-26 ENCOUNTER — Ambulatory Visit (HOSPITAL_COMMUNITY): Payer: Medicare PPO

## 2023-03-11 ENCOUNTER — Ambulatory Visit (HOSPITAL_COMMUNITY): Payer: Medicare PPO

## 2023-03-11 ENCOUNTER — Encounter (HOSPITAL_COMMUNITY): Payer: Self-pay

## 2023-04-04 ENCOUNTER — Ambulatory Visit (HOSPITAL_COMMUNITY)
Admission: RE | Admit: 2023-04-04 | Discharge: 2023-04-04 | Disposition: A | Discharge status: Home / Self Care | Payer: Medicare PPO | Source: Ambulatory Visit | Attending: Internal Medicine | Admitting: Internal Medicine

## 2023-04-04 DIAGNOSIS — R911 Solitary pulmonary nodule: Secondary | ICD-10-CM | POA: Insufficient documentation

## 2024-01-06 DIAGNOSIS — Z961 Presence of intraocular lens: Secondary | ICD-10-CM | POA: Diagnosis not present

## 2024-01-06 DIAGNOSIS — H35372 Puckering of macula, left eye: Secondary | ICD-10-CM | POA: Diagnosis not present

## 2024-01-06 DIAGNOSIS — H353132 Nonexudative age-related macular degeneration, bilateral, intermediate dry stage: Secondary | ICD-10-CM | POA: Diagnosis not present

## 2024-01-06 DIAGNOSIS — H01001 Unspecified blepharitis right upper eyelid: Secondary | ICD-10-CM | POA: Diagnosis not present

## 2024-03-02 DIAGNOSIS — N3000 Acute cystitis without hematuria: Secondary | ICD-10-CM | POA: Diagnosis not present

## 2024-03-09 DIAGNOSIS — L918 Other hypertrophic disorders of the skin: Secondary | ICD-10-CM | POA: Diagnosis not present

## 2024-03-09 DIAGNOSIS — D485 Neoplasm of uncertain behavior of skin: Secondary | ICD-10-CM | POA: Diagnosis not present

## 2024-03-09 DIAGNOSIS — L728 Other follicular cysts of the skin and subcutaneous tissue: Secondary | ICD-10-CM | POA: Diagnosis not present

## 2024-03-09 DIAGNOSIS — D225 Melanocytic nevi of trunk: Secondary | ICD-10-CM | POA: Diagnosis not present

## 2024-03-09 DIAGNOSIS — D2261 Melanocytic nevi of right upper limb, including shoulder: Secondary | ICD-10-CM | POA: Diagnosis not present

## 2024-03-12 ENCOUNTER — Other Ambulatory Visit (HOSPITAL_COMMUNITY)
Admission: RE | Admit: 2024-03-12 | Discharge: 2024-03-12 | Disposition: A | Discharge status: Home / Self Care | Source: Ambulatory Visit | Attending: Obstetrics & Gynecology | Admitting: Obstetrics & Gynecology

## 2024-03-12 ENCOUNTER — Ambulatory Visit: Admitting: Obstetrics & Gynecology

## 2024-03-12 ENCOUNTER — Encounter: Payer: Self-pay | Admitting: Obstetrics & Gynecology

## 2024-03-12 VITALS — BP 104/66 | HR 55 | Ht 65.0 in | Wt 140.0 lb

## 2024-03-12 DIAGNOSIS — Z1151 Encounter for screening for human papillomavirus (HPV): Secondary | ICD-10-CM | POA: Insufficient documentation

## 2024-03-12 DIAGNOSIS — N952 Postmenopausal atrophic vaginitis: Secondary | ICD-10-CM | POA: Diagnosis not present

## 2024-03-12 DIAGNOSIS — N941 Unspecified dyspareunia: Secondary | ICD-10-CM

## 2024-03-12 DIAGNOSIS — Z113 Encounter for screening for infections with a predominantly sexual mode of transmission: Secondary | ICD-10-CM | POA: Insufficient documentation

## 2024-03-12 DIAGNOSIS — Z01419 Encounter for gynecological examination (general) (routine) without abnormal findings: Secondary | ICD-10-CM | POA: Insufficient documentation

## 2024-03-12 DIAGNOSIS — Z124 Encounter for screening for malignant neoplasm of cervix: Secondary | ICD-10-CM

## 2024-03-12 DIAGNOSIS — Z780 Asymptomatic menopausal state: Secondary | ICD-10-CM | POA: Insufficient documentation

## 2024-03-12 MED ORDER — ESTRADIOL 10 MCG VA TABS: ORAL_TABLET | VAGINAL | 12 refills | Status: AC

## 2024-03-12 NOTE — Progress Notes (Signed)
 Chief Complaint  Patient presents with   Pelvic Pain    Pain with intercourse last 2 weeks      68 y.o. G2P0 No LMP recorded. Patient is postmenopausal. The current method of family planning is post menopausal status.  Outpatient Encounter Medications as of 03/12/2024  Medication Sig   ALPRAZolam (XANAX) 1 MG tablet Take 2 mg by mouth at bedtime.   escitalopram (LEXAPRO) 10 MG tablet Take 10 mg by mouth daily.   escitalopram (LEXAPRO) 5 MG tablet Take 2.5 mg by mouth daily.   Estradiol 10 MCG TABS vaginal tablet Nightly fro 14 nights then 3 times per week   No facility-administered encounter medications on file as of 03/12/2024.    Subjective First visit  Here for 2 weeks of discomfort at opening with intercourse Insertional pain No bleeding Menopausal about 15 years Does not use lubrication No discharge or odor Was having itching that has now resolved  Past Medical History:  Diagnosis Date   Anxiety    Anxiety     Past Surgical History:  Procedure Laterality Date   BIOPSY  01/04/2021   Procedure: BIOPSY;  Surgeon: Golda Claudis PENNER, MD;  Location: [facility];  Service: Endoscopy;;   CATARACT EXTRACTION W/PHACO Right 02/21/2016   Procedure: CATARACT EXTRACTION PHACO AND INTRAOCULAR LENS PLACEMENT (IOC);  Surgeon: Oneil Platts, MD;  Location: [facility];  Service: Ophthalmology;  Laterality: Right;  CDE:3.66   CATARACT EXTRACTION W/PHACO Left 03/13/2016   Procedure: CATARACT EXTRACTION PHACO AND INTRAOCULAR LENS PLACEMENT (IOC);  Surgeon: Oneil Platts, MD;  Location: [facility];  Service: Ophthalmology;  Laterality: Left;  CDE: 4.73                     COLONOSCOPY N/A 03/11/2014   Procedure: COLONOSCOPY;  Surgeon: Claudis PENNER Golda, MD;  Location: [facility];  Service: Endoscopy;  Laterality: N/A;  1030   COLONOSCOPY WITH PROPOFOL  N/A 01/04/2021   Procedure: COLONOSCOPY WITH PROPOFOL ;  Surgeon: Golda Claudis PENNER, MD;  Location: [facility];  Service:  Endoscopy;  Laterality: N/A;  am   POLYPECTOMY  01/04/2021   Procedure: POLYPECTOMY;  Surgeon: Golda Claudis PENNER, MD;  Location: [facility];  Service: Endoscopy;;   TONSILLECTOMY     @68  years of age    OB History     Gravida  2   Para      Term      Preterm      AB      Living         SAB      IAB      Ectopic      Multiple      Live Births                Social History   Socioeconomic History   Marital status: Widowed    Spouse name: Not on file   Number of children: Not on file   Years of education: Not on file   Highest education level: Not on file  Occupational History   Not on file  Tobacco Use   Smoking status: Every Day    Current packs/day: 1.50    Types: Cigarettes   Smokeless tobacco: Never  Vaping Use   Vaping status: Never Used  Substance and Sexual Activity   Alcohol use: Yes   Drug use: No   Sexual activity: Yes    Birth control/protection: None  Other Topics Concern  Not on file  Social History Narrative   Not on file   Social Drivers of Health   Financial Resource Strain: Low Risk  (03/12/2024)   Overall Financial Resource Strain (CARDIA)    Difficulty of Paying Living Expenses: Not very hard  Food Insecurity: No Food Insecurity (03/12/2024)   Hunger Vital Sign    Worried About Running Out of Food in the Last Year: Never true    Ran Out of Food in the Last Year: Never true  Transportation Needs: No Transportation Needs (03/12/2024)   PRAPARE - Administrator, Civil Service (Medical): No    Lack of Transportation (Non-Medical): No  Physical Activity: Sufficiently Active (03/12/2024)   Exercise Vital Sign    Days of Exercise per Week: 4 days    Minutes of Exercise per Session: 100 min  Stress: Stress Concern Present (03/12/2024)   [employer] of Occupational Health - Occupational Stress Questionnaire    Feeling of Stress: To some extent  Social Connections: Moderately Isolated (03/12/2024)   Social  Connection and Isolation Panel    Frequency of Communication with Friends and Family: More than three times a week    Frequency of Social Gatherings with Friends and Family: More than three times a week    Attends Religious Services: Never    Database administrator or Organizations: Yes    Attends Banker Meetings: 1 to 4 times per year    Marital Status: Widowed    Family History  Problem Relation Age of Onset   Colon cancer Father    Cancer Father        prostate    Colon cancer Other     Medications:       Current Outpatient Medications:    ALPRAZolam (XANAX) 1 MG tablet, Take 2 mg by mouth at bedtime., Disp: , Rfl:    escitalopram (LEXAPRO) 10 MG tablet, Take 10 mg by mouth daily., Disp: , Rfl:    escitalopram (LEXAPRO) 5 MG tablet, Take 2.5 mg by mouth daily., Disp: , Rfl:    Estradiol 10 MCG TABS vaginal tablet, Nightly fro 14 nights then 3 times per week, Disp: 30 tablet, Rfl: 12  Objective Blood pressure 104/66, pulse (!) 55, height 5' 5 (1.651 m), weight 140 lb (63.5 kg).  General WDWN female NAD Vulva:  normal appearing vulva with no masses, tenderness or lesions Vagina:  normal mucosa, no discharge Cervix:  Normal no lesions Uterus:  normal size, contour, position, consistency, mobility, non-tender Adnexa: ovaries:present,  normal adnexa in size, nontender and no masses   Pertinent ROS No burning with urination, frequency or urgency No nausea, vomiting or diarrhea Nor fever chills or other constitutional symptoms   Labs or studies No new in EPIC    Impression + Management Plan: Diagnoses this Encounter::   ICD-10-CM   1. Dyspareunia, female  N94.10     2. Vaginal atrophy due to menopause  N95.2         Medications prescribed during  this encounter: Meds ordered this encounter  Medications   Estradiol 10 MCG TABS vaginal tablet    Sig: Nightly fro 14 nights then 3 times per week    Dispense:  30 tablet    Refill:  12    Labs or  Scans Ordered during this encounter: No orders of the defined types were placed in this encounter.     Follow up No follow-ups on file.

## 2024-03-12 NOTE — Addendum Note (Signed)
 Addended by: ILEAN RUTHERFORD HERO on: 03/12/2024 10:04 AM   Modules accepted: Orders

## 2024-03-17 LAB — CYTOLOGY - PAP
Adequacy: ABSENT
Chlamydia: NEGATIVE
Comment: NEGATIVE
Comment: NEGATIVE
Comment: NEGATIVE
Comment: NORMAL
Diagnosis: NEGATIVE
High risk HPV: NEGATIVE
Neisseria Gonorrhea: NEGATIVE
Trichomonas: NEGATIVE

## 2024-03-30 ENCOUNTER — Encounter: Admitting: Obstetrics & Gynecology

## 2024-07-06 DIAGNOSIS — H01001 Unspecified blepharitis right upper eyelid: Secondary | ICD-10-CM | POA: Diagnosis not present

## 2024-07-06 DIAGNOSIS — H353132 Nonexudative age-related macular degeneration, bilateral, intermediate dry stage: Secondary | ICD-10-CM | POA: Diagnosis not present

## 2024-07-06 DIAGNOSIS — H35372 Puckering of macula, left eye: Secondary | ICD-10-CM | POA: Diagnosis not present

## 2024-07-06 DIAGNOSIS — Z961 Presence of intraocular lens: Secondary | ICD-10-CM | POA: Diagnosis not present

## 2024-08-10 DIAGNOSIS — N6021 Fibroadenosis of right breast: Secondary | ICD-10-CM | POA: Diagnosis not present

## 2024-08-10 DIAGNOSIS — F419 Anxiety disorder, unspecified: Secondary | ICD-10-CM | POA: Diagnosis not present

## 2024-08-10 DIAGNOSIS — F17210 Nicotine dependence, cigarettes, uncomplicated: Secondary | ICD-10-CM | POA: Diagnosis not present

## 2024-08-10 DIAGNOSIS — R001 Bradycardia, unspecified: Secondary | ICD-10-CM | POA: Diagnosis not present

## 2024-08-10 DIAGNOSIS — Z79899 Other long term (current) drug therapy: Secondary | ICD-10-CM | POA: Diagnosis not present

## 2024-08-17 ENCOUNTER — Encounter: Payer: Self-pay | Admitting: Internal Medicine

## 2024-08-17 DIAGNOSIS — R001 Bradycardia, unspecified: Secondary | ICD-10-CM | POA: Diagnosis not present

## 2024-08-17 DIAGNOSIS — Z0001 Encounter for general adult medical examination with abnormal findings: Secondary | ICD-10-CM | POA: Diagnosis not present

## 2024-08-17 DIAGNOSIS — F17210 Nicotine dependence, cigarettes, uncomplicated: Secondary | ICD-10-CM | POA: Diagnosis not present

## 2024-08-17 DIAGNOSIS — I700 Atherosclerosis of aorta: Secondary | ICD-10-CM | POA: Diagnosis not present

## 2024-08-17 DIAGNOSIS — J430 Unilateral pulmonary emphysema [MacLeod's syndrome]: Secondary | ICD-10-CM | POA: Diagnosis not present

## 2024-08-17 DIAGNOSIS — F411 Generalized anxiety disorder: Secondary | ICD-10-CM | POA: Diagnosis not present

## 2024-08-18 ENCOUNTER — Other Ambulatory Visit (HOSPITAL_COMMUNITY): Payer: Self-pay | Admitting: Internal Medicine

## 2024-08-18 DIAGNOSIS — R911 Solitary pulmonary nodule: Secondary | ICD-10-CM

## 2024-09-18 ENCOUNTER — Ambulatory Visit (HOSPITAL_COMMUNITY)
Admission: RE | Admit: 2024-09-18 | Discharge: 2024-09-18 | Disposition: A | Discharge status: Home / Self Care | Source: Ambulatory Visit | Attending: Internal Medicine | Admitting: Internal Medicine

## 2024-09-18 DIAGNOSIS — J439 Emphysema, unspecified: Secondary | ICD-10-CM | POA: Diagnosis not present

## 2024-09-18 DIAGNOSIS — R911 Solitary pulmonary nodule: Secondary | ICD-10-CM | POA: Diagnosis present

## 2024-09-18 DIAGNOSIS — I700 Atherosclerosis of aorta: Secondary | ICD-10-CM | POA: Insufficient documentation

## 2024-09-18 DIAGNOSIS — F17210 Nicotine dependence, cigarettes, uncomplicated: Secondary | ICD-10-CM | POA: Insufficient documentation

## 2024-09-18 DIAGNOSIS — Z122 Encounter for screening for malignant neoplasm of respiratory organs: Secondary | ICD-10-CM | POA: Diagnosis not present
